# Patient Record
Sex: Female | Born: 1970 | Race: White | Hispanic: No | Marital: Single | Smoking: Current every day smoker
Health system: Southern US, Community
[De-identification: ages and names within clinical notes are randomized; demographics above are authoritative.]

## PROBLEM LIST (undated history)

## (undated) DIAGNOSIS — F32A Depression, unspecified: Secondary | ICD-10-CM

## (undated) DIAGNOSIS — Z5189 Encounter for other specified aftercare: Secondary | ICD-10-CM

## (undated) DIAGNOSIS — F419 Anxiety disorder, unspecified: Secondary | ICD-10-CM

## (undated) DIAGNOSIS — E079 Disorder of thyroid, unspecified: Secondary | ICD-10-CM

## (undated) DIAGNOSIS — K219 Gastro-esophageal reflux disease without esophagitis: Secondary | ICD-10-CM

## (undated) DIAGNOSIS — K760 Fatty (change of) liver, not elsewhere classified: Secondary | ICD-10-CM

## (undated) DIAGNOSIS — T7840XA Allergy, unspecified, initial encounter: Secondary | ICD-10-CM

## (undated) DIAGNOSIS — F329 Major depressive disorder, single episode, unspecified: Secondary | ICD-10-CM

## (undated) DIAGNOSIS — J45909 Unspecified asthma, uncomplicated: Secondary | ICD-10-CM

## (undated) HISTORY — DX: Disorder of thyroid, unspecified: E07.9

## (undated) HISTORY — DX: Major depressive disorder, single episode, unspecified: F32.9

## (undated) HISTORY — DX: Anxiety disorder, unspecified: F41.9

## (undated) HISTORY — DX: Depression, unspecified: F32.A

## (undated) HISTORY — DX: Gastro-esophageal reflux disease without esophagitis: K21.9

## (undated) HISTORY — DX: Fatty (change of) liver, not elsewhere classified: K76.0

## (undated) HISTORY — DX: Encounter for other specified aftercare: Z51.89

## (undated) HISTORY — DX: Unspecified asthma, uncomplicated: J45.909

## (undated) HISTORY — DX: Allergy, unspecified, initial encounter: T78.40XA

## (undated) HISTORY — PX: TONSILECTOMY, ADENOIDECTOMY, BILATERAL MYRINGOTOMY AND TUBES: SHX2538

## (undated) HISTORY — PX: EXTERNAL EAR SURGERY: SHX627

---

## 2018-05-06 ENCOUNTER — Encounter: Payer: Self-pay | Admitting: Pediatrics

## 2018-05-06 ENCOUNTER — Ambulatory Visit: Payer: Medicaid Other | Admitting: Pediatrics

## 2018-05-06 VITALS — BP 125/79 | HR 87 | Temp 98.4°F | Ht 65.0 in | Wt 222.4 lb

## 2018-05-06 DIAGNOSIS — M25512 Pain in left shoulder: Secondary | ICD-10-CM

## 2018-05-06 DIAGNOSIS — M79671 Pain in right foot: Secondary | ICD-10-CM

## 2018-05-06 DIAGNOSIS — K921 Melena: Secondary | ICD-10-CM

## 2018-05-06 DIAGNOSIS — M79672 Pain in left foot: Secondary | ICD-10-CM

## 2018-05-06 DIAGNOSIS — E039 Hypothyroidism, unspecified: Secondary | ICD-10-CM

## 2018-05-06 DIAGNOSIS — R5383 Other fatigue: Secondary | ICD-10-CM | POA: Diagnosis not present

## 2018-05-06 DIAGNOSIS — K219 Gastro-esophageal reflux disease without esophagitis: Secondary | ICD-10-CM

## 2018-05-06 DIAGNOSIS — F909 Attention-deficit hyperactivity disorder, unspecified type: Secondary | ICD-10-CM

## 2018-05-06 DIAGNOSIS — G8929 Other chronic pain: Secondary | ICD-10-CM

## 2018-05-06 MED ORDER — LEVOTHYROXINE SODIUM 175 MCG PO TABS: 175.0000 ug | ORAL_TABLET | Freq: Every day | ORAL | 1 refills | Status: DC

## 2018-05-06 MED ORDER — RANITIDINE HCL 150 MG PO TABS: 150.0000 mg | ORAL_TABLET | Freq: Every day | ORAL | 1 refills | Status: DC

## 2018-05-06 NOTE — Progress Notes (Signed)
Subjective:   Patient ID: Julie Alvarado, female    DOB: Mar 26, 1971, 47 y.o.   MRN: [id number] CC: New Patient (Initial Visit) Follow-up multiple medical problems and establish care HPI: Julie Alvarado is a 47 y.o. female   Recently moved here from [place].  Hypothyroidism: Taking medicine regularly.  ADHD: Was diagnosed by her PCP following testing over a decade ago she thinks.  Has been taking ADHD medicine since then off and on when she is able to get it with insurance.  Does help with her focus.  Reflux: Taking ranitidine regularly, symptoms stable.  Foot pain: Both of her feet hurt after she is been walking for some time.  She has arches the fall when she stands.  She thinks it starting to worsen her back pain.  Shoulder pain: Left shoulder she is not able to raise greater than 90 degrees from her side without assistance from her other arm due to pain mostly in the posterior shoulder and proximal arm.  Blood in stool: No pain with passing stool, no constipation, has never had trouble with hemorrhoids before.  Usually just blood on the toilet paper, sometimes small, sometimes dime sized or larger.  Once there was a  mucousy bright red blood clot that she passed.  No family history of colon cancer that she is aware of.  Past Medical History:  Diagnosis Date  . Allergy   . GERD (gastroesophageal reflux disease)   . Thyroid disease    Family History  Problem Relation Age of Onset  . Cancer Mother   . COPD Mother   . Cancer Maternal Grandmother   . COPD Maternal Grandmother    Social History   Socioeconomic History  . Marital status: Single    Spouse name: Not on file  . Number of children: Not on file  . Years of education: Not on file  . Highest education level: Not on file  Occupational History  . Not on file  Social Needs  . Financial resource strain: Not on file  . Food insecurity:    Worry: Not on file    Inability: Not on file  . Transportation needs:   Medical: Not on file    Non-medical: Not on file  Tobacco Use  . Smoking status: Current Every Day Smoker    Packs/day: 0.50  . Smokeless tobacco: Never Used  Substance and Sexual Activity  . Alcohol use: Never    Frequency: Never  . Drug use: Never  . Sexual activity: Yes    Birth control/protection: IUD  Lifestyle  . Physical activity:    Days per week: Not on file    Minutes per session: Not on file  . Stress: Not on file  Relationships  . Social connections:    Talks on phone: Not on file    Gets together: Not on file    Attends religious service: Not on file    Active member of club or organization: Not on file    Attends meetings of clubs or organizations: Not on file    Relationship status: Not on file  Other Topics Concern  . Not on file  Social History Narrative  . Not on file   ROS: All systems negative other than what is in HPI  Objective:    BP 125/79   Pulse 87   Temp 98.4 F (36.9 C) (Oral)   Ht '5\' 5"'$  (1.651 m)   Wt 222 lb 6.4 oz (100.9 kg)   BMI 37.01 kg/m  Wt Readings from Last 3 Encounters:  05/06/18 222 lb 6.4 oz (100.9 kg)    Gen: NAD, alert, cooperative with exam, NCAT EYES: EOMI, no conjunctival injection, or no icterus ENT:  TMs pearly gray b/l, OP without erythema LYMPH: no cervical LAD CV: NRRR, normal S1/S2, no murmur, distal pulses 2+ b/l Resp: CTABL, no wheezes, normal WOB Abd: +BS, soft, NTND.  Ext: No edema, warm Neuro: Alert and oriented, strength equal b/l UE and LE, coordination grossly normal MSK: Pronates both feet with standing. Left shoulder decreased strength in rotator cuff muscles left arm compared to right including with empty can.  Equal strength bilaterally with internal and external rotation at the shoulder  Assessment & Plan:  Krystine was seen today for new patient (initial visit) and follow-up multiple medical problems.  Diagnoses and all orders for this visit:  Gastroesophageal reflux disease, esophagitis  presence not specified Stable, continue below -     ranitidine (ZANTAC) 150 MG tablet; Take 1 tablet (150 mg total) by mouth daily.  Hypothyroidism, unspecified type Stable, continue below -     levothyroxine (SYNTHROID, LEVOTHROID) 175 MCG tablet; Take 1 tablet (175 mcg total) by mouth daily before breakfast. -     TSH  Other fatigue -     BMP8+EGFR -     CBC with Differential/Platelet  Pain in both feet -     Ambulatory referral to Podiatry  Chronic left shoulder pain -     Ambulatory referral to Orthopedic Surgery  Blood in stool Referral to GI  Attention deficit hyperactivity disorder (ADHD), unspecified ADHD type We will request records again from prior PCP, will bring patient back to discuss next steps once we have received those records including original testing for ADHD -     ToxASSURE Select 13 (MW), Urine   Follow up plan: Return in about 2 weeks (around 05/20/2018). Assunta Found, MD [place]

## 2018-05-07 LAB — CBC WITH DIFFERENTIAL/PLATELET
BASOS: 1 %
Basophils Absolute: 0 10*3/uL (ref 0.0–0.2)
EOS (ABSOLUTE): 0.2 10*3/uL (ref 0.0–0.4)
Eos: 3 %
Hematocrit: 42.4 % (ref 34.0–46.6)
Hemoglobin: 14.7 g/dL (ref 11.1–15.9)
IMMATURE GRANS (ABS): 0 10*3/uL (ref 0.0–0.1)
IMMATURE GRANULOCYTES: 0 %
LYMPHS: 33 %
Lymphocytes Absolute: 2.3 10*3/uL (ref 0.7–3.1)
MCH: 32.9 pg (ref 26.6–33.0)
MCHC: 34.7 g/dL (ref 31.5–35.7)
MCV: 95 fL (ref 79–97)
MONOS ABS: 0.6 10*3/uL (ref 0.1–0.9)
Monocytes: 8 %
NEUTROS PCT: 55 %
Neutrophils Absolute: 3.8 10*3/uL (ref 1.4–7.0)
PLATELETS: 295 10*3/uL (ref 150–450)
RBC: 4.47 x10E6/uL (ref 3.77–5.28)
RDW: 12.5 % (ref 12.3–15.4)
WBC: 6.9 10*3/uL (ref 3.4–10.8)

## 2018-05-07 LAB — BMP8+EGFR
BUN/Creatinine Ratio: 11 (ref 9–23)
BUN: 10 mg/dL (ref 6–24)
CALCIUM: 9.3 mg/dL (ref 8.7–10.2)
CHLORIDE: 104 mmol/L (ref 96–106)
CO2: 22 mmol/L (ref 20–29)
Creatinine, Ser: 0.88 mg/dL (ref 0.57–1.00)
GFR, EST AFRICAN AMERICAN: 91 mL/min/{1.73_m2} (ref >59)
GFR, EST NON AFRICAN AMERICAN: 79 mL/min/{1.73_m2} (ref >59)
Glucose: 102 mg/dL — ABNORMAL HIGH (ref 65–99)
Potassium: 4.3 mmol/L (ref 3.5–5.2)
Sodium: 142 mmol/L (ref 134–144)

## 2018-05-07 LAB — TSH: TSH: 1.02 u[IU]/mL (ref 0.450–4.500)

## 2018-05-09 ENCOUNTER — Encounter: Payer: Self-pay | Admitting: Internal Medicine

## 2018-05-11 ENCOUNTER — Ambulatory Visit (INDEPENDENT_AMBULATORY_CARE_PROVIDER_SITE_OTHER): Payer: Medicaid Other

## 2018-05-11 ENCOUNTER — Encounter (INDEPENDENT_AMBULATORY_CARE_PROVIDER_SITE_OTHER): Payer: Self-pay | Admitting: Orthopaedic Surgery

## 2018-05-11 ENCOUNTER — Ambulatory Visit (INDEPENDENT_AMBULATORY_CARE_PROVIDER_SITE_OTHER): Payer: Medicaid Other | Admitting: Orthopaedic Surgery

## 2018-05-11 VITALS — BP 111/71 | HR 86 | Ht 64.5 in | Wt 222.0 lb

## 2018-05-11 DIAGNOSIS — M25512 Pain in left shoulder: Secondary | ICD-10-CM

## 2018-05-11 DIAGNOSIS — G8929 Other chronic pain: Secondary | ICD-10-CM

## 2018-05-11 DIAGNOSIS — M25532 Pain in left wrist: Secondary | ICD-10-CM | POA: Diagnosis not present

## 2018-05-11 NOTE — Progress Notes (Signed)
   Office Visit Note   Patient: Julie Alvarado           Date of Birth: 1971-01-05           MRN: [id number] Visit Date: 05/11/2018              Requested by: Eustaquio Maize, MD [place], [place] [address] PCP: Eustaquio Maize, MD   Assessment & Plan: Visit Diagnoses:  1. Chronic left shoulder pain   2. Pain in left wrist     Plan: chronic left shoulder pain with mild adhesive capsulitis-PT at [facility]. Prior fx of left distal radius has healed. Follow-up next 4-6 weeks. Office visit greater than 45 min Follow-Up Instructions: Return if symptoms worsen or fail to improve.   Orders:  No orders of the defined types were placed in this encounter.  No orders of the defined types were placed in this encounter.     Procedures: No procedures performed   Clinical Data: No additional findings.   Subjective: No chief complaint on file. hx of left distal radius fracture in July while living in [place]-"just want check up"-no related problems. 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 Pain for at least 5 yrs and worse with overhead activity. No neck pain or radicular symptoms HPI  Review of Systems   Objective: Vital Signs: There were no vitals taken for this visit.  Physical Exam  Constitutional: She is oriented to person, place, and time. She appears well-developed and well-nourished.  HENT:  Mouth/Throat: Oropharynx is clear and moist.  Eyes: Pupils are equal, round, and reactive to light. EOM are normal.  Pulmonary/Chest: Effort normal.  Neurological: She is alert and oriented to person, place, and time.  Skin: Skin is warm and dry.  Psychiatric: She has a normal mood and affect. Her behavior is normal.    Ortho Exam left shoulder with pos impingement and tenderness over GT, skin intact.mild loss of overhead motion. Skin intact. N/v intact. Left wrist with no pain and FROM  Specialty Comments:  No specialty comments  available.  Imaging: No results found.   PMFS History: There are no active problems to display for this patient.  Past Medical History:  Diagnosis Date  . Allergy   . GERD (gastroesophageal reflux disease)   . Thyroid disease     Family History  Problem Relation Age of Onset  . Cancer Mother   . COPD Mother   . Cancer Maternal Grandmother   . COPD Maternal Grandmother     Past Surgical History:  Procedure Laterality Date  . CESAREAN SECTION    . EXTERNAL EAR SURGERY    . TONSILECTOMY, ADENOIDECTOMY, BILATERAL MYRINGOTOMY AND TUBES     Social History   Occupational History  . Not on file  Tobacco Use  . Smoking status: Current Every Day Smoker    Packs/day: 0.50  . Smokeless tobacco: Never Used  Substance and Sexual Activity  . Alcohol use: Never    Frequency: Never  . Drug use: Never  . Sexual activity: Yes    Birth control/protection: IUD     Garald Balding, MD   Note - This record has been created using [employer].  Chart creation errors have been sought, but may not always  have been located. Such creation errors do not reflect on  the standard of medical care.

## 2018-05-12 ENCOUNTER — Ambulatory Visit (INDEPENDENT_AMBULATORY_CARE_PROVIDER_SITE_OTHER): Payer: Medicaid Other | Admitting: Orthopaedic Surgery

## 2018-05-12 LAB — TOXASSURE SELECT 13 (MW), URINE

## 2018-05-16 ENCOUNTER — Ambulatory Visit: Payer: Medicaid Other | Admitting: Pediatrics

## 2018-05-16 ENCOUNTER — Encounter: Payer: Self-pay | Admitting: Pediatrics

## 2018-05-16 VITALS — BP 130/78 | HR 84 | Temp 98.4°F | Ht 65.0 in | Wt 225.2 lb

## 2018-05-16 DIAGNOSIS — G8929 Other chronic pain: Secondary | ICD-10-CM

## 2018-05-16 DIAGNOSIS — M25512 Pain in left shoulder: Secondary | ICD-10-CM | POA: Diagnosis not present

## 2018-05-16 DIAGNOSIS — F909 Attention-deficit hyperactivity disorder, unspecified type: Secondary | ICD-10-CM | POA: Diagnosis not present

## 2018-05-16 MED ORDER — AMPHETAMINE-DEXTROAMPHETAMINE 10 MG PO TABS: 10.0000 mg | ORAL_TABLET | Freq: Three times a day (TID) | ORAL | 0 refills | Status: DC

## 2018-05-16 NOTE — Progress Notes (Signed)
Subjective:   Patient ID: Julie Alvarado, female    DOB: 1971/04/30, 47 y.o.   MRN: [id number] CC: Medical Management of Chronic Issues  HPI: Julie Alvarado is a 47 y.o. female   ADHD: Was diagnosed over 10 years ago.  She has been off the medicine for over 6 months due to problems with cost and recent move from [place].  She was prescribed Adderall 10mg  from her PCPs office previously.  She finds she is having a hard time sitting up life in this area due to an inability to focus.  Hard time getting tasks done such as paying bills, sitting up at the store.  Received records from her prior PCP at [facility].  There she was prescribed and taking Adderall 10 mg 3 times a day though when she was running short on funds she would cut back to taking Adderall once or twice a day.  The Adderall last for about 4 hours. When she is working which she is planning on restarting work in the next month or so, she takes an Adderall in the morning and in the afternoon.  The third Adderall tab she takes when her son gets home from school to help with focus when she is needing to make dinner, help with homework, clean the house and prepare for the next day.  She is been tried on long-acting medicines in the past, they do not seem to have the same effect.  She has noticed an improvement in her concentration, ability to complete tasks when she is taking the Adderall regularly.  She is very frustrated with her self off the Adderall because she is not able to do all that she needs to do for her household or at work when working.  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  She feels safe at home, her mood has been okay.  [facility] was reviewed and consistent with patient records and history.  She was seen at [facility] for her left shoulder pain, diagnosed with adhesive capsulitis.  Recommended to start physical therapy, also  suggested a cortisone shot.  Patient did get a cortisone shot that day.  She would like referral to a female provider.  Relevant past medical, surgical, family and social history reviewed. Allergies and medications reviewed and updated. Social History   Tobacco Use  Smoking Status Current Every Day Smoker  . Packs/day: 0.50  Smokeless Tobacco Never Used   ROS: Per HPI   Objective:    BP 130/78   Pulse 84   Temp 98.4 F (36.9 C) (Oral)   Ht 5\' 5"  (1.651 m)   Wt 225 lb 3.2 oz (102.2 kg)   BMI 37.48 kg/m   Wt Readings from Last 3 Encounters:  05/16/18 225 lb 3.2 oz (102.2 kg)  05/06/18 222 lb 6.4 oz (100.9 kg)    Gen: NAD, alert, cooperative with exam, NCAT EYES: EOMI, no conjunctival injection, or no icterus ENT:  TMs pearly gray b/l, OP without erythema LYMPH: no cervical LAD CV: NRRR, normal S1/S2, no murmur, distal pulses 2+ b/l Resp: CTABL, no wheezes, normal WOB Abd: +BS, soft, NTND. no guarding or organomegaly Ext: No edema, warm Neuro: Alert and oriented, strength equal b/l UE and LE, coordination grossly normal MSK: normal muscle bulk  Assessment & Plan:  Julie Alvarado was seen today for medical management of chronic issues.  Diagnoses and all orders for this  visit:  Attention deficit hyperactivity disorder (ADHD), unspecified ADHD type Ongoing symptoms.  Asked about further resources for "life coach" to help with some of her ADHD symptoms.  She is not ready to start counseling now, but may be interested in the future.  Gave information for psychology today.com for her to search based on her insurance which may be changing in the future.  Patient to continue to follow blood pressures at home.  Let me know if persistently elevated once below is restarted.  Reviewed controlled substance agreement.  Must be seen for refills.  Urine tox done last visit, appropriate for not currently being on the medicine. -     amphetamine-dextroamphetamine (ADDERALL) 10 MG tablet; Take 1 tablet  (10 mg total) by mouth 3 (three) times daily before meals. -     amphetamine-dextroamphetamine (ADDERALL) 10 MG tablet; Take 1 tablet (10 mg total) by mouth 3 (three) times daily. -     amphetamine-dextroamphetamine (ADDERALL) 10 MG tablet; Take 1 tablet (10 mg total) by mouth 3 (three) times daily.  Chronic left shoulder pain Referral to [facility] orthopedic center, has a female nurse practitioner there.  -     AMB referral to orthopedics  I spent 25 minutes with the patient with over 50% of the encounter time dedicated to counseling on the above problems.   Follow up plan: Return in about 3 months (around 08/16/2018). Assunta Found, MD [place]

## 2018-05-16 NOTE — Patient Instructions (Addendum)
[email or web address]  120s/70s goal blood pressures

## 2018-05-23 ENCOUNTER — Encounter: Payer: Self-pay | Admitting: Podiatry

## 2018-05-23 ENCOUNTER — Ambulatory Visit (INDEPENDENT_AMBULATORY_CARE_PROVIDER_SITE_OTHER): Payer: Medicaid Other

## 2018-05-23 ENCOUNTER — Ambulatory Visit: Payer: Medicaid Other | Admitting: Podiatry

## 2018-05-23 VITALS — BP 133/83 | HR 88

## 2018-05-23 DIAGNOSIS — M7750 Other enthesopathy of unspecified foot: Secondary | ICD-10-CM

## 2018-05-23 DIAGNOSIS — M7752 Other enthesopathy of left foot: Secondary | ICD-10-CM

## 2018-05-23 DIAGNOSIS — M7751 Other enthesopathy of right foot: Secondary | ICD-10-CM

## 2018-05-23 DIAGNOSIS — M79676 Pain in unspecified toe(s): Secondary | ICD-10-CM

## 2018-05-23 NOTE — Patient Instructions (Signed)

## 2018-05-23 NOTE — Progress Notes (Signed)
   Subjective:    Patient ID: Julie Alvarado, female    DOB: 12-02-1970, 47 y.o.   MRN: [id number]  HPI Bilateral foot pain    Review of Systems  Musculoskeletal: Positive for back pain.  All other systems reviewed and are negative.      Objective:   Physical Exam        Assessment & Plan:

## 2018-05-25 NOTE — Progress Notes (Signed)
Subjective:   Patient ID: Julie Alvarado, female   DOB: 47 y.o.   MRN: [id number]   HPI Patient presents with pain in both feet states that she also gets problems with her back and feels like they could be related and is been an ongoing issue with moderate obesity also complicating factor   Review of Systems  All other systems reviewed and are negative.       Objective:  Physical Exam  Constitutional: She appears well-developed and well-nourished.  Cardiovascular: Intact distal pulses.  Pulmonary/Chest: Effort normal.  Musculoskeletal: Normal range of motion.  Neurological: She is alert.  Skin: Skin is warm.  Nursing note and vitals reviewed.   Neurovascular status was found to be intact muscle strength is adequate with moderate depression of the arch bilateral with information of the posterior tibial tendon bilateral with no indications of dysfunction of the tendon.  Patient did have good digital perfusion well oriented x3     Assessment:  Moderate depression of the arch bilateral with posterior tibial tendinitis symptomatology     Plan:  H&P condition reviewed and at this point discussed orthotics to try to lift the arch up and patient will pursue this option and we will do the best we can to help her and I also recommended over-the-counter devices.  Reappoint to recheck again for casting and encouraged her to call with questions  X-rays indicate no indications of advanced arthritis with mild depression of the arch noted bilateral upon weightbearing

## 2018-06-08 ENCOUNTER — Encounter: Payer: Self-pay | Admitting: Internal Medicine

## 2018-06-08 ENCOUNTER — Ambulatory Visit: Payer: Medicaid Other | Admitting: Internal Medicine

## 2018-06-08 VITALS — BP 124/64 | HR 78 | Ht 64.0 in | Wt 224.0 lb

## 2018-06-08 DIAGNOSIS — K625 Hemorrhage of anus and rectum: Secondary | ICD-10-CM | POA: Diagnosis not present

## 2018-06-08 DIAGNOSIS — K219 Gastro-esophageal reflux disease without esophagitis: Secondary | ICD-10-CM

## 2018-06-08 NOTE — Patient Instructions (Signed)
It has been recommended to you by your physician that you have a(n) colonoscopy completed. Per your request, we did not schedule the procedure(s) today. Please contact our office at [phone] should you decide to have the procedure completed. 

## 2018-06-08 NOTE — Progress Notes (Signed)
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 Evette Doffing regarding GERD and rectal bleeding.  The patient reports daily problems with painless hematochezia in August and September 2019.  Principally bright red blood on the tissue though she did not look at the stool.  No associated abdominal or rectal pain.  No change in stool caliber.  No weight loss.  No family history of colon cancer.  No prior history of GI evaluations or GI problems.  In recent weeks no significant problems with bleeding.  Her review of outside blood work from May 06, 2018 shows unremarkable comprehensive metabolic panel and CBC.  Hemoglobin 14.7.  No relevant x-ray abnormalities identified.  Patient also reports chronic GERD.  She had been on omeprazole regularly for some time but is quite worried about long-term effects".  Currently taking ranitidine 150 mg once daily.  She will experience breakthrough symptoms.  Particularly with dietary indiscretion.  No dysphasia.  No family history of esophageal malignancy.  REVIEW OF SYSTEMS:  All non-GI ROS negative less otherwise stated in the HPI except for sinus and allergy, anxiety, back pain, fatigue, menstrual pain, night sweats  Past Medical History:  Diagnosis Date  . Allergy   . GERD (gastroesophageal reflux disease)   . Thyroid disease     Past Surgical History:  Procedure Laterality Date  . CESAREAN SECTION    . EXTERNAL EAR SURGERY    . TONSILECTOMY, ADENOIDECTOMY, BILATERAL MYRINGOTOMY AND TUBES      Social History Kriti Katayama  reports that she has been smoking. She has been smoking about 0.50 packs per day. She has never used smokeless tobacco. She reports that she does not drink alcohol or use drugs.  family history includes COPD in her maternal grandmother and mother; Cancer in her maternal grandmother and mother.  No Known Allergies     PHYSICAL  EXAMINATION: Vital signs: BP 124/64   Pulse 78   Ht 5\' 4"  (1.626 m)   Wt 224 lb (101.6 kg)   BMI 38.45 kg/m   Constitutional: generally well-appearing, no acute distress Psychiatric: alert and oriented x3, cooperative Eyes: extraocular movements intact, anicteric, conjunctiva pink Mouth: oral pharynx moist, no lesions Neck: supple no lymphadenopathy Cardiovascular: heart regular rate and rhythm, no murmur Lungs: clear to auscultation bilaterally Abdomen: soft, obese, nontender, nondistended, no obvious ascites, no peritoneal signs, normal bowel sounds, no organomegaly Rectal: Deferred until colonoscopy Extremities: no lower extremity edema bilaterally Skin: no lesions on visible extremities Neuro: No focal deficits.  Cranial nerves intact  ASSESSMENT:  1.  Intermittent rectal bleeding.  Etiology uncertain.  Rule out neoplasia.  Rule out benign anorectal pathology such as internal hemorrhoids.  Normal hemoglobin 2.  Chronic GERD.  No alarm features 3.  Obesity   PLAN:  1.  Colonoscopy to evaluate bleeding.The nature of the procedure, as well as the risks, benefits, and alternatives were carefully and thoroughly reviewed with the patient. Ample time for discussion and questions allowed. The patient understood, was satisfied, and agreed to proceed. 2.  Reflux precautions with attention to weight loss 3.  Would increase ranitidine to 150 mg twice daily for breakthrough symptoms.  Could consider PPI if H2 receptor antagonist therapy ineffective 4.  Ongoing general medical care with Dr. Evette Doffing   A copy of this consultation note has been sent to Dr. Evette Doffing

## 2018-06-13 ENCOUNTER — Encounter: Payer: Medicaid Other | Admitting: Orthotics

## 2018-06-14 ENCOUNTER — Ambulatory Visit (AMBULATORY_SURGERY_CENTER): Payer: Self-pay

## 2018-06-14 VITALS — Ht 64.5 in | Wt 225.2 lb

## 2018-06-14 DIAGNOSIS — K625 Hemorrhage of anus and rectum: Secondary | ICD-10-CM

## 2018-06-14 MED ORDER — NA SULFATE-K SULFATE-MG SULF 17.5-3.13-1.6 GM/177ML PO SOLN: 1.0000 | Freq: Once | ORAL | 0 refills | Status: AC

## 2018-06-14 NOTE — Progress Notes (Signed)
Denies allergies to eggs or soy products. Denies complication of anesthesia or sedation. Denies use of weight loss medication. Denies use of O2.   Emmi instructions declined.  

## 2018-06-21 ENCOUNTER — Encounter: Payer: Self-pay | Admitting: Internal Medicine

## 2018-06-21 ENCOUNTER — Ambulatory Visit (AMBULATORY_SURGERY_CENTER): Payer: Medicaid Other | Admitting: Internal Medicine

## 2018-06-21 VITALS — BP 127/77 | HR 74 | Temp 98.4°F | Resp 17 | Ht 64.0 in | Wt 224.0 lb

## 2018-06-21 DIAGNOSIS — D123 Benign neoplasm of transverse colon: Secondary | ICD-10-CM | POA: Diagnosis not present

## 2018-06-21 DIAGNOSIS — K625 Hemorrhage of anus and rectum: Secondary | ICD-10-CM

## 2018-06-21 DIAGNOSIS — D125 Benign neoplasm of sigmoid colon: Secondary | ICD-10-CM

## 2018-06-21 DIAGNOSIS — K635 Polyp of colon: Secondary | ICD-10-CM

## 2018-06-21 HISTORY — PX: COLONOSCOPY: SHX174

## 2018-06-21 MED ORDER — SODIUM CHLORIDE 0.9 % IV SOLN: 500.0000 mL | Freq: Once | INTRAVENOUS | Status: DC

## 2018-06-21 NOTE — Progress Notes (Signed)
PT taken to PACU. Monitors in place. VSS. Report given to RN. 

## 2018-06-21 NOTE — Patient Instructions (Signed)
Handouts provided:  Polyps  YOU HAD AN ENDOSCOPIC PROCEDURE TODAY AT THE [facility]:   Refer to the procedure report that was given to you for any specific questions about what was found during the examination.  If the procedure report does not answer your questions, please call your gastroenterologist to clarify.  If you requested that your care partner not be given the details of your procedure findings, then the procedure report has been included in a sealed envelope for you to review at your convenience later.  YOU SHOULD EXPECT: Some feelings of bloating in the abdomen. Passage of more gas than usual.  Walking can help get rid of the air that was put into your GI tract during the procedure and reduce the bloating. If you had a lower endoscopy (such as a colonoscopy or flexible sigmoidoscopy) you may notice spotting of blood in your stool or on the toilet paper. If you underwent a bowel prep for your procedure, you may not have a normal bowel movement for a few days.  Please Note:  You might notice some irritation and congestion in your nose or some drainage.  This is from the oxygen used during your procedure.  There is no need for concern and it should clear up in a day or so.  SYMPTOMS TO REPORT IMMEDIATELY:   Following lower endoscopy (colonoscopy or flexible sigmoidoscopy):  Excessive amounts of blood in the stool  Significant tenderness or worsening of abdominal pains  Swelling of the abdomen that is new, acute  Fever of 100F or higher  For urgent or emergent issues, a gastroenterologist can be reached at any hour by calling [phone].   DIET:  We do recommend a small meal at first, but then you may proceed to your regular diet.  Drink plenty of fluids but you should avoid alcoholic beverages for 24 hours.  ACTIVITY:  You should plan to take it easy for the rest of today and you should NOT DRIVE or use heavy machinery until tomorrow (because of the sedation  medicines used during the test).    FOLLOW UP: Our staff will call the number listed on your records the next business day following your procedure to check on you and address any questions or concerns that you may have regarding the information given to you following your procedure. If we do not reach you, we will leave a message.  However, if you are feeling well and you are not experiencing any problems, there is no need to return our call.  We will assume that you have returned to your regular daily activities without incident.  If any biopsies were taken you will be contacted by phone or by letter within the next 1-3 weeks.  Please call us at [phone] if you have not heard about the biopsies in 3 weeks.    SIGNATURES/CONFIDENTIALITY: You and/or your care partner have signed paperwork which will be entered into your electronic medical record.  These signatures attest to the fact that that the information above on your After Visit Summary has been reviewed and is understood.  Full responsibility of the confidentiality of this discharge information lies with you and/or your care-partner.  

## 2018-06-21 NOTE — Progress Notes (Signed)
Called to room to assist during endoscopic procedure.  Patient ID and intended procedure confirmed with present staff. Received instructions for my participation in the procedure from the performing physician.  

## 2018-06-21 NOTE — Op Note (Addendum)
[facility] Patient Name: Julie Alvarado Procedure Date: 06/21/2018 12:58 PM MRN: [id number] Endoscopist: Docia Chuck. Henrene Pastor , MD Age: 47 Referring MD:  Date of Birth: [birth year] Gender: Female Account #: [id number] Procedure:                Colonoscopy with cold and hot snare polypectomy                            each x1; submucosal injection Indications:              Rectal bleeding Medicines:                Monitored Anesthesia Care Procedure:                Pre-Anesthesia Assessment:                           - Prior to the procedure, a History and Physical                            was performed, and patient medications and                            allergies were reviewed. The patient's tolerance of                            previous anesthesia was also reviewed. The risks                            and benefits of the procedure and the sedation                            options and risks were discussed with the patient.                            All questions were answered, and informed consent                            was obtained. Prior Anticoagulants: The patient has                            taken no previous anticoagulant or antiplatelet                            agents. ASA Grade Assessment: II - A patient with                            mild systemic disease. After reviewing the risks                            and benefits, the patient was deemed in                            satisfactory condition to undergo the procedure.  After obtaining informed consent, the colonoscope                            was passed under direct vision. Throughout the                            procedure, the patient's blood pressure, pulse, and                            oxygen saturations were monitored continuously. The                            Model CF-HQ190L [id number]) scope was introduced                            through the anus and  advanced to the the cecum,                            identified by appendiceal orifice and ileocecal                            valve. The ileocecal valve, appendiceal orifice,                            and rectum were photographed. The quality of the                            bowel preparation was excellent. The colonoscopy                            was performed without difficulty. The patient                            tolerated the procedure well. The bowel preparation                            used was SUPREP. Scope In: 1:02:23 PM Scope Out: 1:18:57 PM Scope Withdrawal Time: 0 hours 14 minutes 1 second  Total Procedure Duration: 0 hours 16 minutes 34 seconds  Findings:                 A 4 mm polyp was found in the transverse colon. The                            polyp was removed with a cold snare. Resection and                            retrieval were complete.                           A 20 mm polyp was found in the sigmoid colon.at 20                            cm from the anal vergepolyp was  semi-pedunculated.                            The polyp was removed with a hot snare. Resection                            and retrieval were complete. Area was tattooed with                            an injection of 2 mL of Spot (carbon black). Please                            see the images                           The exam was otherwise without abnormality on                            direct and retroflexion views. Complications:            No immediate complications. Estimated blood loss:                            None. Estimated Blood Loss:     Estimated blood loss: none. Impression:               - One 4 mm polyp in the transverse colon, removed                            with a cold snare. Resected and retrieved.                           - One 20 mm polyp in the sigmoid colon, removed                            with a hot snare. Resected and retrieved. Tattooed.                            - The examination was otherwise normal on direct                            and retroflexion views. Recommendation:           - Repeat colonoscopy in 3 years for surveillance if                            lesions benign.                           - Patient has a contact number available for                            emergencies. The signs and symptoms of potential                            delayed  complications were discussed with the                            patient. Return to normal activities tomorrow.                            Written discharge instructions were provided to the                            patient.                           - Resume previous diet.                           - Continue present medications.                           - Await pathology results. Docia Chuck. Henrene Pastor, MD 06/21/2018 1:28:59 PM This report has been signed electronically.

## 2018-06-22 ENCOUNTER — Telehealth: Payer: Self-pay | Admitting: *Deleted

## 2018-06-22 NOTE — Telephone Encounter (Signed)
  Follow up Call-  Call back number 06/21/2018  Post procedure Call Back phone  # [phone]  Permission to leave phone message Yes     Patient questions:  Do you have a fever, pain , or abdominal swelling? No. Pain Score  0 *  Have you tolerated food without any problems? Yes.    Have you been able to return to your normal activities? Yes.    Do you have any questions about your discharge instructions: Diet   No. Medications  No. Follow up visit  No.  Do you have questions or concerns about your Care? No.  Actions: * If pain score is 4 or above: No action needed, pain <4.

## 2018-06-27 ENCOUNTER — Encounter: Payer: Self-pay | Admitting: Internal Medicine

## 2018-06-29 ENCOUNTER — Encounter: Payer: Self-pay | Admitting: Pediatrics

## 2018-07-08 ENCOUNTER — Ambulatory Visit: Payer: Medicaid Other | Admitting: Pediatrics

## 2018-07-08 ENCOUNTER — Ambulatory Visit: Payer: Medicaid Other | Admitting: Orthotics

## 2018-07-08 ENCOUNTER — Encounter: Payer: Self-pay | Admitting: Pediatrics

## 2018-07-08 VITALS — BP 135/88 | HR 81 | Temp 97.1°F | Ht 64.0 in | Wt 226.4 lb

## 2018-07-08 DIAGNOSIS — K219 Gastro-esophageal reflux disease without esophagitis: Secondary | ICD-10-CM | POA: Diagnosis not present

## 2018-07-08 DIAGNOSIS — Z720 Tobacco use: Secondary | ICD-10-CM

## 2018-07-08 DIAGNOSIS — F909 Attention-deficit hyperactivity disorder, unspecified type: Secondary | ICD-10-CM | POA: Diagnosis not present

## 2018-07-08 DIAGNOSIS — E039 Hypothyroidism, unspecified: Secondary | ICD-10-CM | POA: Diagnosis not present

## 2018-07-08 DIAGNOSIS — M7750 Other enthesopathy of unspecified foot: Secondary | ICD-10-CM

## 2018-07-08 MED ORDER — LEVOTHYROXINE SODIUM 175 MCG PO TABS: 175.0000 ug | ORAL_TABLET | Freq: Every day | ORAL | 1 refills | Status: DC

## 2018-07-08 MED ORDER — SUCRALFATE 1 GM/10ML PO SUSP: 1.0000 g | Freq: Three times a day (TID) | ORAL | 0 refills | Status: DC | PRN

## 2018-07-08 MED ORDER — BUPROPION HCL ER (SR) 150 MG PO TB12: ORAL_TABLET | ORAL | 3 refills | Status: DC

## 2018-07-08 NOTE — Progress Notes (Signed)
Patient came in today to pick up custom made foot orthotics.  The goals were accomplished and the patient reported no dissatisfaction with said orthotics.  Patient was advised of breakin period and how to report any issues. 

## 2018-07-08 NOTE — Progress Notes (Signed)
  Subjective:   Patient ID: Julie Alvarado, female    DOB: [birth year], 47 y.o.   MRN: [id number] CC: Medical Management of Chronic Issues  HPI: Julie Alvarado is a 47 y.o. female   Here for refill of ADHD meds.  3 prescriptions were sent in last visit 6 weeks ago.  Called pharmacy, they report having two prescriptions on file.  Tobacco use: Interested in trial of Wellbutrin to see if will help with cravings.  Very interested in cessation.  Stomach pain: Skipped breakfast this morning.  Having epigastric discomfort right now.  Relevant past medical, surgical, family and social history reviewed. Allergies and medications reviewed and updated. Social History   Tobacco Use  Smoking Status Current Every Day Smoker  . Packs/day: 0.50  Smokeless Tobacco Never Used   ROS: Per HPI   Objective:    BP 135/88   Pulse 81   Temp (!) 97.1 F (36.2 C) (Oral)   Ht 5\' 4"  (1.626 m)   Wt 226 lb 6.4 oz (102.7 kg)   BMI 38.86 kg/m   Wt Readings from Last 3 Encounters:  07/08/18 226 lb 6.4 oz (102.7 kg)  06/21/18 224 lb (101.6 kg)  06/14/18 225 lb 3.2 oz (102.2 kg)    Gen: NAD, alert, cooperative with exam, NCAT EYES: EOMI, no conjunctival injection, or no icterus CV: NRRR, normal S1/S2, no murmur, distal pulses 2+ b/l Resp: CTABL, no wheezes, normal WOB Abd: +BS, soft, NTND. Ext: No edema, warm Neuro: Alert and oriented, strength equal b/l UE and LE, coordination grossly normal MSK: normal muscle bulk  Assessment & Plan:  [name] was seen today for medical management of chronic issues.  Diagnoses and all orders for this visit:  Gastroesophageal reflux disease, esophagitis presence not specified Continue omeprazole.  Can take below for breakthrough symptoms.  If ongoing let me know.  Rarely happens to this extent per pt. -     sucralfate (CARAFATE) 1 GM/10ML suspension; Take 10 mLs (1 g total) by mouth 3 (three) times daily as needed.  Attention deficit hyperactivity disorder (ADHD),  unspecified ADHD type Should be able to pick up this month prescription in next month prescription from pharmacy.  Patient to let me know if any further trouble.  Hypothyroidism, unspecified type -     levothyroxine (SYNTHROID, LEVOTHROID) 175 MCG tablet; Take 1 tablet (175 mcg total) by mouth daily before breakfast.  Tobacco use Cessation strategies discussed, start below -     buPROPion (WELLBUTRIN SR) 150 MG 12 hr tablet; Once daily for 3 days then twice a day   Follow up plan: Return in about 6 weeks (around 08/19/2018). Assunta Found, MD [place]

## 2018-08-16 ENCOUNTER — Ambulatory Visit: Payer: Medicaid Other | Admitting: Pediatrics

## 2018-08-17 ENCOUNTER — Ambulatory Visit: Payer: Medicaid Other | Admitting: Pediatrics

## 2018-08-26 ENCOUNTER — Ambulatory Visit: Payer: Medicaid Other | Admitting: Pediatrics

## 2018-08-29 ENCOUNTER — Encounter: Payer: Self-pay | Admitting: Family Medicine

## 2018-08-29 ENCOUNTER — Ambulatory Visit: Payer: Medicaid Other | Admitting: Family Medicine

## 2018-08-29 VITALS — BP 116/68 | HR 87 | Temp 97.8°F | Ht 64.0 in | Wt 223.0 lb

## 2018-08-29 DIAGNOSIS — F17200 Nicotine dependence, unspecified, uncomplicated: Secondary | ICD-10-CM

## 2018-08-29 DIAGNOSIS — F900 Attention-deficit hyperactivity disorder, predominantly inattentive type: Secondary | ICD-10-CM | POA: Diagnosis not present

## 2018-08-29 DIAGNOSIS — E038 Other specified hypothyroidism: Secondary | ICD-10-CM

## 2018-08-29 DIAGNOSIS — E063 Autoimmune thyroiditis: Secondary | ICD-10-CM

## 2018-08-29 DIAGNOSIS — F909 Attention-deficit hyperactivity disorder, unspecified type: Secondary | ICD-10-CM | POA: Insufficient documentation

## 2018-08-29 MED ORDER — AMPHETAMINE-DEXTROAMPHETAMINE 10 MG PO TABS: ORAL_TABLET | ORAL | 0 refills | Status: DC

## 2018-08-29 NOTE — Patient Instructions (Addendum)
As we discussed, if you'd like a referral to a gyn for your pap/ IUD removal/ replacement, let me know.   Dr Dettinger does offer these services here.  If you'd like to schedule an appointment with him, please do so.  Pertaining to your mammogram, these services are offered here.  Please schedule an appointment at checkout.

## 2018-08-29 NOTE — Progress Notes (Signed)
Subjective: CC: f/u ADHD, tobacco use d/o PCP: Julie Norlander, DO VOH:YWVP [name] is a 48 y.o. female presenting to clinic today for:  1. ADHD, inattentive Patient reports diagnosis of adult onset ADHD in her 24s.  She has been treated with Adderall, which she takes 20 mg every morning and 10 mg daily afternoon.  She reports control of symptoms with this.  She does report dry mouth but this is relieved by hydration.  Denies any constipation, increased anxiety, insomnia or heart palpitations.  Certainly no chest pain or shortness of breath.  She is interested in biofeedback and wonders if this is covered by her insurance.  She would like to pursue nonmedicinal treatment if possible.  2.  Tobacco use disorder Patient reports longstanding history of tobacco use.  She started smoking around age 55 and smokes 1 pack/day.  She is intermittently quit for up to 3 years at a time after the delivery of her child but has continued smoking since that time.  She denies any shortness of breath.  She was prescribed Wellbutrin at last visit by her previous PCP in efforts to help with cessation, ADHD as above and even possibly weight loss.  She has not started this medicine because she voices reluctance to start additional medications.  She has taken Wellbutrin in the past and did fine with it but did quit cold [place] and subsequently had symptoms related to withdrawal from the medicine.  She does plan on starting this soon as she is motivated to stop smoking.  3.  Hashimoto's thyroiditis Patient reports longstanding history of Hashimoto thyroiditis.  She takes Synthroid 175 mcg daily and does well with this.  Denies any constipation.  No other family members with autoimmune disease including thyroid disease, type 1 diabetes, rheumatoid arthritis or lupus.  4.  Preventive care Patient has not had mammogram or Pap smear in quite some time.  She thinks her last Pap smear was after the insertion of her IUD  which will be due to come out in April.  She does not have a gynecologist locally.  She was last seen in [place].  She also needs mammogram.  Family history significant for breast cancer in her mother which was diagnosed in her 73s.   ROS: Per HPI  No Known Allergies Past Medical History:  Diagnosis Date  . Allergy   . Anxiety   . Asthma   . Blood transfusion without reported diagnosis   . Depression   . GERD (gastroesophageal reflux disease)   . Thyroid disease     Current Outpatient Medications:  .  amphetamine-dextroamphetamine (ADDERALL) 10 MG tablet, Take 1 tablet (10 mg total) by mouth 3 (three) times daily., Disp: 90 tablet, Rfl: 0 .  levothyroxine (SYNTHROID, LEVOTHROID) 175 MCG tablet, Take 1 tablet (175 mcg total) by mouth daily before breakfast., Disp: 90 tablet, Rfl: 1 .  loratadine (CLARITIN) 10 MG tablet, Take 10 mg by mouth daily., Disp: , Rfl:  .  Multiple Vitamin (MULTIVITAMIN) tablet, Take 1 tablet by mouth daily., Disp: , Rfl:  .  omeprazole (PRILOSEC) 20 MG capsule, Take 20 mg by mouth daily., Disp: , Rfl:  .  OVER THE COUNTER MEDICATION, Vitamin D 3 2000 IU one capsule daily., Disp: , Rfl:  .  buPROPion (WELLBUTRIN SR) 150 MG 12 hr tablet, Once daily for 3 days then twice a day (Patient not taking: Reported on 08/29/2018), Disp: 60 tablet, Rfl: 3 .  sucralfate (CARAFATE) 1 GM/10ML suspension, Take 10 mLs (  1 g total) by mouth 3 (three) times daily as needed. (Patient not taking: Reported on 08/29/2018), Disp: 420 mL, Rfl: 0 Social History   Socioeconomic History  . Marital status: Single    Spouse name: Not on file  . Number of children: Not on file  . Years of education: Not on file  . Highest education level: Not on file  Occupational History  . Not on file  Social Needs  . Financial resource strain: Not on file  . Food insecurity:    Worry: Not on file    Inability: Not on file  . Transportation needs:    Medical: Not on file    Non-medical: Not on  file  Tobacco Use  . Smoking status: Current Every Day Smoker    Packs/day: 0.50  . Smokeless tobacco: Never Used  Substance and Sexual Activity  . Alcohol use: Yes    Frequency: Never    Comment: Rarely  . Drug use: Never  . Sexual activity: Yes    Birth control/protection: I.U.D.  Lifestyle  . Physical activity:    Days per week: Not on file    Minutes per session: Not on file  . Stress: Not on file  Relationships  . Social connections:    Talks on phone: Not on file    Gets together: Not on file    Attends religious service: Not on file    Active member of club or organization: Not on file    Attends meetings of clubs or organizations: Not on file    Relationship status: Not on file  . Intimate partner violence:    Fear of current or ex partner: Not on file    Emotionally abused: Not on file    Physically abused: Not on file    Forced sexual activity: Not on file  Other Topics Concern  . Not on file  Social History Narrative  . Not on file   Family History  Problem Relation Age of Onset  . Cancer Mother   . COPD Mother   . Cancer Maternal Grandmother   . COPD Maternal Grandmother   . Colon cancer Neg Hx   . Esophageal cancer Neg Hx   . Rectal cancer Neg Hx   . Stomach cancer Neg Hx     Objective: Office vital signs reviewed. BP 116/68   Pulse 87   Temp 97.8 F (36.6 C) (Oral)   Ht 5\' 4"  (1.626 m)   Wt 223 lb (101.2 kg)   BMI 38.28 kg/m   Physical Examination:  General: Awake, alert, well nourished, No acute distress HEENT: Normal    Neck: No goiter noted    Eyes: sclera white, no exophthalmos Cardio: regular rate and rhythm, S1S2 heard, no murmurs appreciated Pulm: clear to auscultation bilaterally, no wheezes, rhonchi or rales; normal work of breathing on room air Psych: Mood stable, speech normal, affect appropriate, pleasant interactive Depression screen [facility] 2/9 08/29/2018 07/08/2018 05/16/2018  Decreased Interest 0 0 0  Down, Depressed,  Hopeless 0 0 0  PHQ - 2 Score 0 0 0  Altered sleeping 0 - -  Tired, decreased energy 0 - -  Change in appetite 0 - -  Feeling bad or failure about yourself  0 - -  Trouble concentrating 0 - -  Moving slowly or fidgety/restless 0 - -  Suicidal thoughts 0 - -  PHQ-9 Score 0 - -  Difficult doing work/chores Not difficult at all - -   GAD 7 :  Generalized Anxiety Score 08/29/2018  Nervous, Anxious, on Edge 1  Control/stop worrying 1  Worry too much - different things 1  Trouble relaxing 0  Restless 0  Easily annoyed or irritable 0  Afraid - awful might happen 0  Total GAD 7 Score 3  Anxiety Difficulty Somewhat difficult    Assessment/ Plan: 48 y.o. female   1. Attention deficit hyperactivity disorder (ADHD), predominantly inattentive type Seemingly stable on Adderall 20 mg every morning and 10 mg every afternoon.  We briefly discussed consideration for extended release.  Patient may even benefit from alternative therapies like Vyvanse.  I certainly think it is worth pursuing biofeedback for treatment of ADHD if this interest her and is covered by her insurance.  She will look into this further and I will happily place a referral if she desires.  Otherwise, her Adderall has been refilled x3 months.  Her UDS should be obtained at next visit, as she did not take her dose today.  Updated controlled substance contract signed and reviewed with the patient today. - amphetamine-dextroamphetamine (ADDERALL) 10 MG tablet; Take 20mg  every morning and 10mg  every afternoon  Dispense: 90 tablet; Refill: 0  2. Hypothyroidism due to Hashimoto's thyroiditis Stable.  I reviewed her last TSH which was in normal range in October.  No refills needed at this time  3. Tobacco use disorder We discussed Wellbutrin further today.  I think that she should consider pursuing this medication.  She will follow-up with me in 3 months, sooner if needed.  Pertaining to her preventative care, she will schedule a  mammogram at checkout today.  I have instructed her to either contact me for referral to GYN or schedule an appoint with Dr. Warrick Parisian for Pap smear with IUD removal and replacement.  The Narcotic Database has been reviewed.  There were no red flags.     No orders of the defined types were placed in this encounter.  Meds ordered this encounter  Medications  . amphetamine-dextroamphetamine (ADDERALL) 10 MG tablet    Sig: Take 20mg  every morning and 10mg  every afternoon    Dispense:  90 tablet    Refill:  0  . amphetamine-dextroamphetamine (ADDERALL) 10 MG tablet    Sig: Take 20mg  every morning and 10mg  every afternoon    Dispense:  90 tablet    Refill:  0  . amphetamine-dextroamphetamine (ADDERALL) 10 MG tablet    Sig: Take 20mg  every morning and 10mg  every afternoon    Dispense:  90 tablet    Refill:  0     Svetlana Bagby Windell Moulding, DO [place] [phone]

## 2018-09-02 ENCOUNTER — Encounter: Payer: Self-pay | Admitting: *Deleted

## 2018-11-29 ENCOUNTER — Ambulatory Visit: Payer: Medicaid Other | Admitting: Family Medicine

## 2018-12-05 ENCOUNTER — Encounter: Payer: Self-pay | Admitting: Family Medicine

## 2018-12-05 ENCOUNTER — Ambulatory Visit (INDEPENDENT_AMBULATORY_CARE_PROVIDER_SITE_OTHER): Payer: Medicaid Other | Admitting: Family Medicine

## 2018-12-05 ENCOUNTER — Other Ambulatory Visit: Payer: Self-pay

## 2018-12-05 DIAGNOSIS — F17200 Nicotine dependence, unspecified, uncomplicated: Secondary | ICD-10-CM | POA: Diagnosis not present

## 2018-12-05 DIAGNOSIS — E038 Other specified hypothyroidism: Secondary | ICD-10-CM | POA: Diagnosis not present

## 2018-12-05 DIAGNOSIS — E063 Autoimmune thyroiditis: Secondary | ICD-10-CM

## 2018-12-05 DIAGNOSIS — K219 Gastro-esophageal reflux disease without esophagitis: Secondary | ICD-10-CM

## 2018-12-05 DIAGNOSIS — M7712 Lateral epicondylitis, left elbow: Secondary | ICD-10-CM

## 2018-12-05 DIAGNOSIS — F900 Attention-deficit hyperactivity disorder, predominantly inattentive type: Secondary | ICD-10-CM | POA: Diagnosis not present

## 2018-12-05 MED ORDER — BUPROPION HCL ER (XL) 150 MG PO TB24: 150.0000 mg | ORAL_TABLET | Freq: Every day | ORAL | 1 refills | Status: DC

## 2018-12-05 MED ORDER — AMPHETAMINE-DEXTROAMPHETAMINE 10 MG PO TABS: ORAL_TABLET | ORAL | 0 refills | Status: DC

## 2018-12-05 MED ORDER — LEVOTHYROXINE SODIUM 175 MCG PO TABS: 175.0000 ug | ORAL_TABLET | Freq: Every day | ORAL | 1 refills | Status: DC

## 2018-12-05 MED ORDER — SUCRALFATE 1 GM/10ML PO SUSP: 1.0000 g | Freq: Three times a day (TID) | ORAL | 0 refills | Status: DC | PRN

## 2018-12-05 MED ORDER — DICLOFENAC SODIUM 1 % TD GEL: 4.0000 g | Freq: Four times a day (QID) | TRANSDERMAL | 1 refills | Status: DC

## 2018-12-05 NOTE — Progress Notes (Signed)
Telephone visit  Subjective: CC: ADHD PCP: Janora Norlander, DO LHT:DSKA Julie Alvarado is a 48 y.o. female calls for telephone consult today. Patient provides verbal consent for consult held via phone.  Location of patient: home Location of provider: [facility] Others present for call: none  1. ADHD Patient reports stability of ADHD.  She reports compliance with 20 mg of Adderall every morning and 10 mg of Adderall every afternoon.  No constipation, abdominal pain, heart palpitations, dry mouth.  2. Hashimoto's thyroiditis Seemingly controlled.  She still struggles with weight loss.  She denies any constipation, diarrhea, tremor, heart palpitations.  3.  Tobacco use disorder Patient reports that she has been gradually reducing cigarettes 1 cigarette every few weeks.  She was able to get down to 3 cigarettes/day then the COVID-19 outbreak occurred and she is back up to 4 cigarettes/day.  She reports intermittent compliance with Wellbutrin.  She is often not remembering to take the evening dose.  She is not entirely sure that it is effective but again has not been compliant with the medicine.  No hemoptysis.  No change in breathing.  4.  Elbow pain Patient reports left-sided elbow pain that has been flared for the last several weeks.  She has a history of left-sided shoulder pain.  She notes that when she raised her left upper extremity up that she had worsening of the pain.  She has been applying ice, using Tylenol and a brace in efforts to improve pain.  She wonders if she should seek specialist evaluation or physical therapy.  She did find physical therapy for the shoulder previously helpful.   ROS: Per HPI  No Known Allergies Past Medical History:  Diagnosis Date  . Allergy   . Anxiety   . Asthma   . Blood transfusion without reported diagnosis   . Depression   . GERD (gastroesophageal reflux disease)   . Thyroid disease     Current Outpatient Medications:  .   amphetamine-dextroamphetamine (ADDERALL) 10 MG tablet, Take 20mg  every morning and 10mg  every afternoon, Disp: 90 tablet, Rfl: 0 .  amphetamine-dextroamphetamine (ADDERALL) 10 MG tablet, Take 20mg  every morning and 10mg  every afternoon, Disp: 90 tablet, Rfl: 0 .  amphetamine-dextroamphetamine (ADDERALL) 10 MG tablet, Take 20mg  every morning and 10mg  every afternoon, Disp: 90 tablet, Rfl: 0 .  buPROPion (WELLBUTRIN SR) 150 MG 12 hr tablet, Once daily for 3 days then twice a day (Patient not taking: Reported on 08/29/2018), Disp: 60 tablet, Rfl: 3 .  famotidine (PEPCID) 20 MG tablet, Take 20 mg by mouth daily., Disp: , Rfl:  .  levothyroxine (SYNTHROID, LEVOTHROID) 175 MCG tablet, Take 1 tablet (175 mcg total) by mouth daily before breakfast., Disp: 90 tablet, Rfl: 1 .  loratadine (CLARITIN) 10 MG tablet, Take 10 mg by mouth daily., Disp: , Rfl:  .  Multiple Vitamin (MULTIVITAMIN) tablet, Take 1 tablet by mouth daily., Disp: , Rfl:  .  OVER THE COUNTER MEDICATION, Vitamin D 3 2000 IU one capsule daily., Disp: , Rfl:  .  sucralfate (CARAFATE) 1 GM/10ML suspension, Take 10 mLs (1 g total) by mouth 3 (three) times daily as needed. (Patient not taking: Reported on 08/29/2018), Disp: 420 mL, Rfl: 0  Assessment/ Plan: 48 y.o. female   1. Attention deficit hyperactivity disorder (ADHD), predominantly inattentive type Controlled.  [place] narcotic database reviewed.  There were no red flags.  Refill Adderall - amphetamine-dextroamphetamine (ADDERALL) 10 MG tablet; Take 20mg  every morning and 10mg  every afternoon  Dispense: 90 tablet;  Refill: 0 - amphetamine-dextroamphetamine (ADDERALL) 10 MG tablet; Take 20mg  every morning and 10mg  every afternoon  Dispense: 90 tablet; Refill: 0 - amphetamine-dextroamphetamine (ADDERALL) 10 MG tablet; Take 20mg  every morning and 10mg  every afternoon  Dispense: 90 tablet; Refill: 0  2. Hypothyroidism due to Hashimoto's thyroiditis Working on weight loss.  Plan for repeat TSH in  the next 3 to 6 months. - levothyroxine (SYNTHROID) 175 MCG tablet; Take 1 tablet (175 mcg total) by mouth daily before breakfast.  Dispense: 90 tablet; Refill: 1  3. Gastroesophageal reflux disease without esophagitis Intermittently controlled.  Refill Carafate.  Continue Pepcid. - sucralfate (CARAFATE) 1 GM/10ML suspension; Take 10 mLs (1 g total) by mouth 3 (three) times daily as needed.  Dispense: 420 mL; Refill: 0  4. Tobacco use disorder In the action phase of cessation.  Some difficulty remembering Wellbutrin.  Therefore I have placed her on the extended release dose.  This should help with depressive symptoms as well.  5. Lateral epicondylitis of left elbow Use topical Voltaren.  Cannot take oral NSAIDs secondary to GERD.  Continue Tylenol as needed.  She will consider referral to orthopedics if ongoing issue.  We also discussed consideration for prednisone. - diclofenac sodium (VOLTAREN) 1 % GEL; Apply 4 g topically 4 (four) times daily.  Dispense: 400 g; Refill: 1   Start time: 1:26pm End time: 1:44pm  Total time spent on patient care (including telephone call/ virtual visit): 22 minutes  [place], [place] [phone]

## 2018-12-05 NOTE — Patient Instructions (Signed)
I couldn't get the PDF to upload, so I have mailed the home PT for the elbow to your home.   Tennis Elbow  Tennis elbow (lateral epicondylitis) is inflammation of tendons in your outer forearm, near your elbow. Tendons are tissues that connect muscle to bone. When you have tennis elbow, inflammation affects the tendons that you use to bend your wrist and move your hand up. Inflammation occurs in the lower part of the upper arm bone (humerus), where the tendons connect to the bone (lateral epicondyle). Tennis elbow often affects people who play tennis, but anyone may get the condition from repeatedly extending the wrist or turning the forearm. What are the causes? This condition is usually caused by repeatedly extending the wrist, turning the forearm, and using the hands. It can result from sports or work that requires repetitive forearm movements. In some cases, it may be caused by a sudden injury. What increases the risk? You are more likely to develop tennis elbow if you play tennis or another racket sport. You also have a higher risk if you frequently use your hands for work. Besides people who play tennis, others at greater risk include:  Musicians.  Carpenters, painters, and plumbers.  Cooks.  Cashiers.  People who work in [employer].  Architect workers.  Butchers.  People who use computers. What are the signs or symptoms? Symptoms of this condition include:  Pain and tenderness in the forearm and the outer part of the elbow. Pain may be felt only when using the arm, or it may be there all the time.  A burning feeling that starts in the elbow and spreads down the forearm.  A weak grip in the hand. How is this diagnosed? This condition may be diagnosed based on:  Your symptoms and medical history.  A physical exam.  X-rays.  MRI. How is this treated? Resting and icing your arm is often the first treatment. Your health care provider may also recommend:  Medicines  to reduce pain and inflammation. These may be in the form of a pill, topical gels, or shots of a steroid medicine (cortisone).  An elbow strap to reduce stress on the area.  Physical therapy. This may include massage or exercises.  An elbow brace to restrict the movements that cause symptoms. If these treatments do not help relieve your symptoms, your health care provider may recommend surgery to remove damaged muscle and reattach healthy muscle to bone. Follow these instructions at home: Activity  Rest your elbow and wrist and avoid activities that cause symptoms, as told by your health care provider.  Do physical therapy exercises as instructed.  If you lift an object, lift it with your palm facing up. This reduces stress on your elbow. Lifestyle  If your tennis elbow is caused by sports, check your equipment and make sure that: ? You are using it correctly. ? It is the best fit for you.  If your tennis elbow is caused by work or computer use, take frequent breaks to stretch your arm. Talk with your manager about ways to manage your condition at work. If you have a brace:  Wear the brace or strap as told by your health care provider. Remove it only as told by your health care provider.  Loosen the brace if your fingers tingle, become numb, or turn cold and blue.  Keep the brace clean.  If the brace is not waterproof, ask if you may remove it for bathing. If you must keep the  brace on while bathing: ? Do not let it get wet. ? Cover it with a watertight covering when you take a bath or a shower. General instructions   If directed, put ice on the painful area: ? Put ice in a plastic bag. ? Place a towel between your skin and the bag. ? Leave the ice on for 20 minutes, 2-3 times a day.  Take over-the-counter and prescription medicines only as told by your health care provider.  Keep all follow-up visits as told by your health care provider. This is important. Contact a  health care provider if:  You have pain that gets worse or does not get better with treatment.  You have numbness or weakness in your forearm, hand, or fingers. Summary  Tennis elbow (lateral epicondylitis) is inflammation of tendons in your outer forearm, near your elbow.  Common symptoms include pain and tenderness in your forearm and the outer part of your elbow.  This condition is usually caused by repeatedly extending your wrist, turning your forearm, and using your hands.  The first treatment is often resting and icing your arm to relieve symptoms. Further treatment may include taking medicine, getting physical therapy, wearing a brace or strap, or having surgery. This information is not intended to replace advice given to you by your health care provider. Make sure you discuss any questions you have with your health care provider. Document Released: 07/20/2005 Document Revised: 05/04/2017 Document Reviewed: 05/04/2017 Elsevier Interactive Patient Education  2019 [employer].

## 2019-01-24 ENCOUNTER — Other Ambulatory Visit: Payer: Self-pay

## 2019-01-25 ENCOUNTER — Encounter: Payer: Self-pay | Admitting: Family Medicine

## 2019-01-25 ENCOUNTER — Ambulatory Visit: Payer: Medicaid Other | Admitting: Family Medicine

## 2019-01-25 VITALS — BP 119/74 | HR 101 | Temp 97.8°F | Ht 64.0 in | Wt 216.0 lb

## 2019-01-25 DIAGNOSIS — R5383 Other fatigue: Secondary | ICD-10-CM | POA: Diagnosis not present

## 2019-01-25 DIAGNOSIS — E038 Other specified hypothyroidism: Secondary | ICD-10-CM | POA: Diagnosis not present

## 2019-01-25 DIAGNOSIS — E063 Autoimmune thyroiditis: Secondary | ICD-10-CM

## 2019-01-25 NOTE — Patient Instructions (Addendum)
You had labs performed today.  You will be contacted with the results of the labs once they are available, usually in the next 3 business days for routine lab work.  If you had a pap smear or biopsy performed, expect to be contacted in about 7-10 days.  Fatigue If you have fatigue, you feel tired all the time and have a lack of energy or a lack of motivation. Fatigue may make it difficult to start or complete tasks because of exhaustion. In general, occasional or mild fatigue is often a normal response to activity or life. However, long-lasting (chronic) or extreme fatigue may be a symptom of a medical condition. Follow these instructions at home: General instructions  Watch your fatigue for any changes.  Go to bed and get up at the same time every day.  Avoid fatigue by pacing yourself during the day and getting enough sleep at night.  Maintain a healthy weight. Medicines  Take over-the-counter and prescription medicines only as told by your health care provider.  Take a multivitamin, if told by your health care provider.  Do not use herbal or dietary supplements unless they are approved by your health care provider. Activity   Exercise regularly, as told by your health care provider.  Use or practice techniques to help you relax, such as yoga, tai chi, meditation, or massage therapy. Eating and drinking   Avoid heavy meals in the evening.  Eat a well-balanced diet, which includes lean proteins, whole grains, plenty of fruits and vegetables, and low-fat dairy products.  Avoid consuming too much caffeine.  Avoid the use of alcohol.  Drink enough fluid to keep your urine pale yellow. Lifestyle  Change situations that cause you stress. Try to keep your work and personal schedule in balance.  Do not use any products that contain nicotine or tobacco, such as cigarettes and e-cigarettes. If you need help quitting, ask your health care provider.  Do not use drugs. Contact a  health care provider if:  Your fatigue does not get better.  You have a fever.  You suddenly lose or gain weight.  You have headaches.  You have trouble falling asleep or sleeping through the night.  You feel angry, guilty, anxious, or sad.  You are unable to have a bowel movement (constipation).  Your skin is dry.  You have swelling in your legs or another part of your body. Get help right away if:  You feel confused.  Your vision is blurry.  You feel faint or you pass out.  You have a severe headache.  You have severe pain in your abdomen, your back, or the area between your waist and hips (pelvis).  You have chest pain, shortness of breath, or an irregular or fast heartbeat.  You are unable to urinate, or you urinate less than normal.  You have abnormal bleeding, such as bleeding from the rectum, vagina, nose, lungs, or nipples.  You vomit blood.  You have thoughts about hurting yourself or others. If you ever feel like you may hurt yourself or others, or have thoughts about taking your own life, get help right away. You can go to your nearest emergency department or call:  Your local emergency services (911 in the [place]  A suicide crisis helpline, such as the National Suicide Prevention Lifeline at 1-800-273-8255. This is open 24 hours a day. Summary  If you have fatigue, you feel tired all the time and have a lack of energy or a lack of   motivation.  Fatigue may make it difficult to start or complete tasks because of exhaustion.  Long-lasting (chronic) or extreme fatigue may be a symptom of a medical condition.  Exercise regularly, as told by your health care provider.  Change situations that cause you stress. Try to keep your work and personal schedule in balance. This information is not intended to replace advice given to you by your health care provider. Make sure you discuss any questions you have with your health care provider. Document Released:  05/17/2007 Document Revised: 04/14/2017 Document Reviewed: 04/14/2017 Elsevier Interactive Patient Education  2019 [employer] Inc[employer]  

## 2019-01-25 NOTE — Progress Notes (Signed)
Subjective: CC: f/u thyroid PCP: Julie Norlander, DO JJH:ERDE [name] is a 48 y.o. female presenting to clinic today for:  1.  Hashimoto's thyroiditis Patient here for interval follow-up on hypothyroidism.  She is compliant with Synthroid 175 mcg every morning.  Her last TSH was within normal range.  Over the last couple of weeks she has noticed decreased energy and perhaps some intermittent muscle aching.  Denies any fevers, chills, cough.  Denies tremor, change in voice, difficulty swallowing, diarrhea, constipation, change in weight. No biotin use.   ROS: Per HPI  No Known Allergies Past Medical History:  Diagnosis Date  . Allergy   . Anxiety   . Asthma   . Blood transfusion without reported diagnosis   . Depression   . GERD (gastroesophageal reflux disease)   . Thyroid disease     Current Outpatient Medications:  .  [START ON 02/01/2019] amphetamine-dextroamphetamine (ADDERALL) 10 MG tablet, Take 20mg  every morning and 10mg  every afternoon, Disp: 90 tablet, Rfl: 0 .  buPROPion (WELLBUTRIN XL) 150 MG 24 hr tablet, Take 1 tablet (150 mg total) by mouth daily., Disp: 90 tablet, Rfl: 1 .  famotidine (PEPCID) 20 MG tablet, Take 20 mg by mouth daily., Disp: , Rfl:  .  FISH OIL-KRILL OIL PO, Take by mouth., Disp: , Rfl:  .  levothyroxine (SYNTHROID) 175 MCG tablet, Take 1 tablet (175 mcg total) by mouth daily before breakfast., Disp: 90 tablet, Rfl: 1 .  loratadine (CLARITIN) 10 MG tablet, Take 10 mg by mouth daily., Disp: , Rfl:  .  Multiple Vitamin (MULTIVITAMIN) tablet, Take 1 tablet by mouth daily., Disp: , Rfl:  .  amphetamine-dextroamphetamine (ADDERALL) 10 MG tablet, Take 20mg  every morning and 10mg  every afternoon, Disp: 90 tablet, Rfl: 0 .  amphetamine-dextroamphetamine (ADDERALL) 10 MG tablet, Take 20mg  every morning and 10mg  every afternoon, Disp: 90 tablet, Rfl: 0 .  OVER THE COUNTER MEDICATION, Vitamin D 3 2000 IU one capsule daily., Disp: , Rfl:  .  sucralfate  (CARAFATE) 1 GM/10ML suspension, Take 10 mLs (1 g total) by mouth 3 (three) times daily as needed. (Patient not taking: Reported on 01/25/2019), Disp: 420 mL, Rfl: 0 Social History   Socioeconomic History  . Marital status: Single    Spouse name: Not on file  . Number of children: Not on file  . Years of education: Not on file  . Highest education level: Not on file  Occupational History  . Not on file  Social Needs  . Financial resource strain: Not on file  . Food insecurity    Worry: Not on file    Inability: Not on file  . Transportation needs    Medical: Not on file    Non-medical: Not on file  Tobacco Use  . Smoking status: Current Every Day Smoker    Packs/day: 0.20    Years: 15.00    Pack years: 3.00    Types: Cigarettes  . Smokeless tobacco: Never Used  Substance and Sexual Activity  . Alcohol use: Yes    Frequency: Never    Comment: Rarely  . Drug use: Never  . Sexual activity: Yes    Birth control/protection: I.U.D.  Lifestyle  . Physical activity    Days per week: Not on file    Minutes per session: Not on file  . Stress: Not on file  Relationships  . Social Herbalist on phone: Not on file    Gets together: Not on file  Attends religious service: Not on file    Active member of club or organization: Not on file    Attends meetings of clubs or organizations: Not on file    Relationship status: Not on file  . Intimate partner violence    Fear of current or ex partner: Not on file    Emotionally abused: Not on file    Physically abused: Not on file    Forced sexual activity: Not on file  Other Topics Concern  . Not on file  Social History Narrative  . Not on file   Family History  Problem Relation Age of Onset  . Cancer Mother   . COPD Mother   . Cancer Maternal Grandmother   . COPD Maternal Grandmother   . Colon cancer Neg Hx   . Esophageal cancer Neg Hx   . Rectal cancer Neg Hx   . Stomach cancer Neg Hx     Objective: Office  vital signs reviewed. BP 119/74   Pulse (!) 101   Temp 97.8 F (36.6 C) (Oral)   Ht 5\' 4"  (1.626 m)   Wt 216 lb (98 kg)   BMI 37.08 kg/m   Physical Examination:  General: Awake, alert, well nourished, obese. No acute distress HEENT: Normal    Neck: No masses palpated. No lymphadenopathy; no thyromegaly or palpable thyroid nodules    Eyes: Sclera white.  No exophthalmos Cardio: regular rate and rhythm, S1S2 heard, no murmurs appreciated Pulm: clear to auscultation bilaterally, no wheezes, rhonchi or rales; normal work of breathing on room air Extremities: warm, well perfused, No edema, cyanosis or clubbing; +2 pulses bilaterally MSK: Normal gait and station Skin: dry; intact; no rashes or lesions; normal temperature Neuro: No tremor  Assessment/ Plan: 48 y.o. female   1. Hypothyroidism due to Hashimoto's thyroiditis Somewhat symptomatic with decreased energy.  Check thyroid panel.  We will adjust the Synthroid based on today's labs - Thyroid Panel With TSH  2. Fatigue, unspecified type Uncertain etiology.  Potentially related to undertreated thyroid.  Check CBC, B12 and BMP. - CBC - Vitamin E08 - Basic Metabolic Panel   Orders Placed This Encounter  Procedures  . Thyroid Panel With TSH  . CBC  . Vitamin B12  . Basic Metabolic Panel   No orders of the defined types were placed in this encounter.    Julie Norlander, DO [place] [phone]

## 2019-01-26 LAB — THYROID PANEL WITH TSH
Free Thyroxine Index: 2.8 (ref 1.2–4.9)
T3 Uptake Ratio: 25 % (ref 24–39)
T4, Total: 11.1 ug/dL (ref 4.5–12.0)
TSH: 0.145 u[IU]/mL — ABNORMAL LOW (ref 0.450–4.500)

## 2019-02-21 ENCOUNTER — Encounter: Payer: Self-pay | Admitting: Family Medicine

## 2019-02-21 ENCOUNTER — Ambulatory Visit: Payer: Medicaid Other | Admitting: Family Medicine

## 2019-02-21 ENCOUNTER — Other Ambulatory Visit: Payer: Self-pay

## 2019-02-21 VITALS — BP 125/70 | HR 85 | Temp 98.9°F | Ht 64.0 in | Wt 218.4 lb

## 2019-02-21 DIAGNOSIS — Z3009 Encounter for other general counseling and advice on contraception: Secondary | ICD-10-CM | POA: Diagnosis not present

## 2019-02-21 NOTE — Progress Notes (Signed)
BP 125/70   Pulse 85   Temp 98.9 F (37.2 C) (Other (Comment))   Ht 5\' 4"  (1.626 m)   Wt 218 lb 6.4 oz (99.1 kg)   BMI 37.49 kg/m    Subjective:   Patient ID: Julie Alvarado, female    DOB: 1971-04-16, 48 y.o.   MRN: [id number]  HPI: Julie Alvarado is a 48 y.o. female presenting on 02/21/2019 for discuss removing mirena   HPI Patient is coming in today to discuss Mirena.  She has had a Mirena for just over 5 years as of April of this year.  She mainly has the Mirena now for period control because she is not currently sexually active and has not been sexually active in over a year especially with the coronavirus right now.  She does not plan to become sexually active anytime in the near future.  She is due for a Pap smear as well and would like to do both at the same time.  With recommendations and guidelines that the Mirena only last 5 years I counseled her in getting it replaced before she become sexually active anytime in the near future and recommended to come in sooner if that changes.  Since she has good.  Control on it right now anyways and is happy with that she would like to continue with that for now until that time.  Relevant past medical, surgical, family and social history reviewed and updated as indicated. Interim medical history since our last visit reviewed. Allergies and medications reviewed and updated.  Review of Systems  Constitutional: Negative for chills and fever.  Eyes: Negative for visual disturbance.  Respiratory: Negative for chest tightness and shortness of breath.   Cardiovascular: Negative for chest pain and leg swelling.  Gastrointestinal: Negative for abdominal pain.  Genitourinary: Negative for menstrual problem, vaginal bleeding, vaginal discharge and vaginal pain.  Musculoskeletal: Negative for back pain and gait problem.  Skin: Negative for rash.  Neurological: Negative for light-headedness and headaches.  Psychiatric/Behavioral: Negative for  agitation and behavioral problems.  All other systems reviewed and are negative.   Per HPI unless specifically indicated above   Allergies as of 02/21/2019   No Known Allergies     Medication List       Accurate as of February 21, 2019  2:39 PM. If you have any questions, ask your nurse or doctor.        amphetamine-dextroamphetamine 10 MG tablet Commonly known as: Adderall Take 20mg  every morning and 10mg  every afternoon   amphetamine-dextroamphetamine 10 MG tablet Commonly known as: Adderall Take 20mg  every morning and 10mg  every afternoon   amphetamine-dextroamphetamine 10 MG tablet Commonly known as: Adderall Take 20mg  every morning and 10mg  every afternoon   buPROPion 150 MG 24 hr tablet Commonly known as: Wellbutrin XL Take 1 tablet (150 mg total) by mouth daily.   famotidine 20 MG tablet Commonly known as: PEPCID Take 20 mg by mouth daily.   FISH OIL-KRILL OIL PO Take by mouth.   levothyroxine 175 MCG tablet Commonly known as: SYNTHROID Take 1 tablet (175 mcg total) by mouth daily before breakfast.   loratadine 10 MG tablet Commonly known as: CLARITIN Take 10 mg by mouth daily.   multivitamin tablet Take 1 tablet by mouth daily.   OVER THE COUNTER MEDICATION Vitamin D 3 2000 IU one capsule daily.   sucralfate 1 GM/10ML suspension Commonly known as: Carafate Take 10 mLs (1 g total) by mouth 3 (three) times daily as needed.  Objective:   BP 125/70   Pulse 85   Temp 98.9 F (37.2 C) (Other (Comment))   Ht 5\' 4"  (1.626 m)   Wt 218 lb 6.4 oz (99.1 kg)   BMI 37.49 kg/m   Wt Readings from Last 3 Encounters:  02/21/19 218 lb 6.4 oz (99.1 kg)  01/25/19 216 lb (98 kg)  08/29/18 223 lb (101.2 kg)    Physical Exam Vitals signs and nursing note reviewed.  Constitutional:      General: She is not in acute distress.    Appearance: She is well-developed. She is not diaphoretic.  Neurological:     Mental Status: She is alert and oriented to  person, place, and time.     Coordination: Coordination normal.  Psychiatric:        Behavior: Behavior normal.     Assessment & Plan:   Problem List Items Addressed This Visit    None    Visit Diagnoses    Counseling for birth control regarding intrauterine device (IUD)    -  Primary      Patient would like to continue her birth control for now and she would like to hold off on it.  She also hold off on rescheduling for her Pap smear as well.  She is not sexually active and has no plans to be but will get it changed before if that changes. Follow up plan: Return in about 6 months (around 08/24/2019), or if symptoms worsen or fail to improve, for Mirena and Pap smear.  Counseling provided for all of the vaccine components No orders of the defined types were placed in this encounter.   Caryl Pina, MD [facility] 02/21/2019, 2:39 PM

## 2019-02-27 ENCOUNTER — Telehealth: Payer: Self-pay | Admitting: Family Medicine

## 2019-04-03 ENCOUNTER — Encounter: Payer: Self-pay | Admitting: Family Medicine

## 2019-04-04 ENCOUNTER — Other Ambulatory Visit: Payer: Self-pay

## 2019-04-05 ENCOUNTER — Encounter: Payer: Self-pay | Admitting: Family Medicine

## 2019-04-05 ENCOUNTER — Ambulatory Visit (INDEPENDENT_AMBULATORY_CARE_PROVIDER_SITE_OTHER): Payer: Medicaid Other | Admitting: Family Medicine

## 2019-04-05 VITALS — BP 112/66 | HR 78 | Temp 98.6°F | Ht 64.0 in | Wt 221.0 lb

## 2019-04-05 DIAGNOSIS — E038 Other specified hypothyroidism: Secondary | ICD-10-CM | POA: Diagnosis not present

## 2019-04-05 DIAGNOSIS — Z23 Encounter for immunization: Secondary | ICD-10-CM

## 2019-04-05 DIAGNOSIS — E063 Autoimmune thyroiditis: Secondary | ICD-10-CM | POA: Diagnosis not present

## 2019-04-05 DIAGNOSIS — F900 Attention-deficit hyperactivity disorder, predominantly inattentive type: Secondary | ICD-10-CM

## 2019-04-05 MED ORDER — AMPHETAMINE-DEXTROAMPHETAMINE 10 MG PO TABS: ORAL_TABLET | ORAL | 0 refills | Status: DC

## 2019-04-05 NOTE — Progress Notes (Signed)
Subjective: CC: f/u thyroid PCP: Janora Norlander, DO [id number] Julie Alvarado is a 48 y.o. female presenting to clinic today for:  1.  Hashimoto's thyroiditis Patient here for interval follow-up on hypothyroidism.  She is compliant with Synthroid 175 mcg every morning.  Her last TSH was suppressed.  She reports that she did find that her vitamin contained quite a bit of biotin in it and she expects that this is what altered the last lab value.  She denies any tremors, heart palpitations, difficulty with sleep, unplanned weight loss or change in bowel habits.  2.  ADHD predominantly inattentive Patient was last seen in May via telephone visit.  At that time she was doing well.  Today she notes that she continues to do well with Adderall 20 mg every morning and 10 mg every afternoon.  She does have dry mouth but denies any constipation, heart palpitations, insomnia or increased anxiety.  She hydrates fairly well but notes it is not as good now that she has to wear a mask for COVID.  ROS: Per HPI  No Known Allergies Past Medical History:  Diagnosis Date  . Allergy   . Anxiety   . Asthma   . Blood transfusion without reported diagnosis   . Depression   . GERD (gastroesophageal reflux disease)   . Thyroid disease     Current Outpatient Medications:  .  amphetamine-dextroamphetamine (ADDERALL) 10 MG tablet, Take 20mg  every morning and 10mg  every afternoon, Disp: 90 tablet, Rfl: 0 .  amphetamine-dextroamphetamine (ADDERALL) 10 MG tablet, Take 20mg  every morning and 10mg  every afternoon, Disp: 90 tablet, Rfl: 0 .  amphetamine-dextroamphetamine (ADDERALL) 10 MG tablet, Take 20mg  every morning and 10mg  every afternoon, Disp: 90 tablet, Rfl: 0 .  buPROPion (WELLBUTRIN XL) 150 MG 24 hr tablet, Take 1 tablet (150 mg total) by mouth daily., Disp: 90 tablet, Rfl: 1 .  famotidine (PEPCID) 20 MG tablet, Take 20 mg by mouth daily., Disp: , Rfl:  .  FISH OIL-KRILL OIL PO, Take by mouth., Disp: , Rfl:  .   levothyroxine (SYNTHROID) 175 MCG tablet, Take 1 tablet (175 mcg total) by mouth daily before breakfast., Disp: 90 tablet, Rfl: 1 .  loratadine (CLARITIN) 10 MG tablet, Take 10 mg by mouth daily., Disp: , Rfl:  .  Multiple Vitamin (MULTIVITAMIN) tablet, Take 1 tablet by mouth daily., Disp: , Rfl:  .  OVER THE COUNTER MEDICATION, Vitamin D 3 2000 IU one capsule daily., Disp: , Rfl:  .  sucralfate (CARAFATE) 1 GM/10ML suspension, Take 10 mLs (1 g total) by mouth 3 (three) times daily as needed., Disp: 420 mL, Rfl: 0 Social History   Socioeconomic History  . Marital status: Single    Spouse name: Not on file  . Number of children: Not on file  . Years of education: Not on file  . Highest education level: Not on file  Occupational History  . Not on file  Social Needs  . Financial resource strain: Not on file  . Food insecurity    Worry: Not on file    Inability: Not on file  . Transportation needs    Medical: Not on file    Non-medical: Not on file  Tobacco Use  . Smoking status: Current Every Day Smoker    Packs/day: 0.20    Years: 15.00    Pack years: 3.00    Types: Cigarettes  . Smokeless tobacco: Never Used  Substance and Sexual Activity  . Alcohol use: Yes  Frequency: Never    Comment: Rarely  . Drug use: Never  . Sexual activity: Yes    Birth control/protection: I.U.D.  Lifestyle  . Physical activity    Days per week: Not on file    Minutes per session: Not on file  . Stress: Not on file  Relationships  . Social Herbalist on phone: Not on file    Gets together: Not on file    Attends religious service: Not on file    Active member of club or organization: Not on file    Attends meetings of clubs or organizations: Not on file    Relationship status: Not on file  . Intimate partner violence    Fear of current or ex partner: Not on file    Emotionally abused: Not on file    Physically abused: Not on file    Forced sexual activity: Not on file  Other  Topics Concern  . Not on file  Social History Narrative  . Not on file   Family History  Problem Relation Age of Onset  . Cancer Mother   . COPD Mother   . Cancer Maternal Grandmother   . COPD Maternal Grandmother   . Colon cancer Neg Hx   . Esophageal cancer Neg Hx   . Rectal cancer Neg Hx   . Stomach cancer Neg Hx     Objective: Office vital signs reviewed. BP 112/66   Pulse 78   Temp 98.6 F (37 C) (Temporal)   Ht 5\' 4"  (1.626 m)   Wt 221 lb (100.2 kg)   BMI 37.93 kg/m   Physical Examination:  General: Awake, alert, well nourished, obese. No acute distress HEENT: Normal    Neck: No masses palpated. No lymphadenopathy; no thyromegaly or palpable thyroid nodules    Eyes: Sclera white.  No exophthalmos Cardio: regular rate and rhythm, S1S2 heard, no murmurs appreciated Pulm: clear to auscultation bilaterally, no wheezes, rhonchi or rales; normal work of breathing on room air Extremities: warm, well perfused, No edema, cyanosis or clubbing; +2 pulses bilaterally Skin: dry; intact; no rashes or lesions; normal temperature Neuro: No tremor  Assessment/ Plan: 48 y.o. female   1. Hypothyroidism due to Hashimoto's thyroiditis My plan was to check a thyroid panel but patient had not held her biotin for 2 weeks prior to today's visit.  We will plan to get this at her next visit.  I do suspect that her suppressed TSH was likely reflective of a lab error due to concomitant use of biotin.  She is totally asymptomatic at this point.  Continue current regimen.  Follow-up in 3 months for fasting labs and thyroid check  2. Attention deficit hyperactivity disorder (ADHD), predominantly inattentive type Stable.  The national cardiac database was reviewed and there were no red flags.  Follow-up in 3 months. - amphetamine-dextroamphetamine (ADDERALL) 10 MG tablet; Take 20mg  every morning and 10mg  every afternoon  Dispense: 90 tablet; Refill: 0 - amphetamine-dextroamphetamine (ADDERALL)  10 MG tablet; Take 20mg  every morning and 10mg  every afternoon  Dispense: 90 tablet; Refill: 0 - amphetamine-dextroamphetamine (ADDERALL) 10 MG tablet; Take 20mg  every morning and 10mg  every afternoon  Dispense: 90 tablet; Refill: 0    No orders of the defined types were placed in this encounter.  No orders of the defined types were placed in this encounter.    Janora Norlander, DO [place] [phone]

## 2019-05-03 ENCOUNTER — Encounter: Payer: Self-pay | Admitting: Family Medicine

## 2019-05-03 ENCOUNTER — Other Ambulatory Visit: Payer: Self-pay

## 2019-05-03 ENCOUNTER — Ambulatory Visit (INDEPENDENT_AMBULATORY_CARE_PROVIDER_SITE_OTHER): Payer: Medicaid Other | Admitting: Family Medicine

## 2019-05-03 DIAGNOSIS — J0100 Acute maxillary sinusitis, unspecified: Secondary | ICD-10-CM | POA: Diagnosis not present

## 2019-05-03 MED ORDER — FLUCONAZOLE 150 MG PO TABS: 150.0000 mg | ORAL_TABLET | Freq: Once | ORAL | 0 refills | Status: AC

## 2019-05-03 MED ORDER — AMOXICILLIN-POT CLAVULANATE 875-125 MG PO TABS: 1.0000 | ORAL_TABLET | Freq: Two times a day (BID) | ORAL | 0 refills | Status: DC

## 2019-05-03 NOTE — Progress Notes (Signed)
Subjective:    Patient ID: Julie Alvarado, female    DOB: Apr 10, 1971, 48 y.o.   MRN: [id number]   HPI: Julie Alvarado is a 48 y.o. female presenting for Symptoms include congestion, facial pain, nasal congestion, non productive cough, post nasal drip and sinus pressure. There is no fever, chills, or sweats. Onset of symptoms was a few days ago, gradually worsening since that time.     Depression screen [facility] 2/9 04/05/2019 02/21/2019 01/25/2019 08/29/2018 07/08/2018  Decreased Interest 0 0 0 0 0  Down, Depressed, Hopeless 1 0 0 0 0  PHQ - 2 Score 1 0 0 0 0  Altered sleeping 3 - 0 0 -  Tired, decreased energy 0 - 0 0 -  Change in appetite 1 - 0 0 -  Feeling bad or failure about yourself  0 - 0 0 -  Trouble concentrating 0 - 0 0 -  Moving slowly or fidgety/restless 0 - 0 0 -  Suicidal thoughts 0 - 0 0 -  PHQ-9 Score 5 - 0 0 -  Difficult doing work/chores - - - Not difficult at all -     Relevant past medical, surgical, family and social history reviewed and updated as indicated.  Interim medical history since our last visit reviewed. Allergies and medications reviewed and updated.  ROS:  Review of Systems  Constitutional: Negative for activity change, appetite change, chills and fever.  HENT: Positive for congestion, postnasal drip, rhinorrhea and sinus pressure. Negative for ear discharge, ear pain, hearing loss, nosebleeds, sneezing and trouble swallowing.   Respiratory: Negative for chest tightness and shortness of breath.   Cardiovascular: Negative for chest pain and palpitations.  Skin: Negative for rash.     Social History   Tobacco Use  Smoking Status Current Every Day Smoker  . Packs/day: 0.20  . Years: 15.00  . Pack years: 3.00  . Types: Cigarettes  Smokeless Tobacco Never Used       Objective:     Wt Readings from Last 3 Encounters:  04/05/19 221 lb (100.2 kg)  02/21/19 218 lb 6.4 oz (99.1 kg)  01/25/19 216 lb (98 kg)     Exam deferred. Pt. Harboring due  to COVID 19. Phone visit performed.   Assessment & Plan:   1. Acute maxillary sinusitis, recurrence not specified     Meds ordered this encounter  Medications  . fluconazole (DIFLUCAN) 150 MG tablet    Sig: Take 1 tablet (150 mg total) by mouth once for 1 dose. At onset of symptoms. Repeat at end of treatment    Dispense:  2 tablet    Refill:  0  . amoxicillin-clavulanate (AUGMENTIN) 875-125 MG tablet    Sig: Take 1 tablet by mouth 2 (two) times daily for 10 days. Take all of this medication    Dispense:  20 tablet    Refill:  0    No orders of the defined types were placed in this encounter.     Diagnoses and all orders for this visit:  Acute maxillary sinusitis, recurrence not specified  Other orders -     fluconazole (DIFLUCAN) 150 MG tablet; Take 1 tablet (150 mg total) by mouth once for 1 dose. At onset of symptoms. Repeat at end of treatment -     amoxicillin-clavulanate (AUGMENTIN) 875-125 MG tablet; Take 1 tablet by mouth 2 (two) times daily for 10 days. Take all of this medication    Virtual Visit via telephone Note  I discussed the  limitations, risks, security and privacy concerns of performing an evaluation and management service by telephone and the availability of in person appointments. The patient was identified with two identifiers. Pt.expressed understanding and agreed to proceed. Pt. Is at home. Dr. Livia Snellen is in his office.  Follow Up Instructions:   I discussed the assessment and treatment plan with the patient. The patient was provided an opportunity to ask questions and all were answered. The patient agreed with the plan and demonstrated an understanding of the instructions.   The patient was advised to call back or seek an in-person evaluation if the symptoms worsen or if the condition fails to improve as anticipated.   Total minutes including chart review and phone contact time: 12   Follow up plan: Return if symptoms worsen or fail to improve.   Claretta Fraise, MD [place]

## 2019-05-05 ENCOUNTER — Other Ambulatory Visit: Payer: Self-pay | Admitting: *Deleted

## 2019-05-05 DIAGNOSIS — Z20822 Contact with and (suspected) exposure to covid-19: Secondary | ICD-10-CM

## 2019-05-06 LAB — NOVEL CORONAVIRUS, NAA: SARS-CoV-2, NAA: NOT DETECTED

## 2019-05-15 ENCOUNTER — Encounter: Payer: Self-pay | Admitting: Family Medicine

## 2019-05-15 ENCOUNTER — Other Ambulatory Visit: Payer: Self-pay

## 2019-05-15 ENCOUNTER — Ambulatory Visit (INDEPENDENT_AMBULATORY_CARE_PROVIDER_SITE_OTHER): Payer: Medicaid Other | Admitting: Family Medicine

## 2019-05-15 DIAGNOSIS — J0100 Acute maxillary sinusitis, unspecified: Secondary | ICD-10-CM | POA: Diagnosis not present

## 2019-05-15 DIAGNOSIS — J3089 Other allergic rhinitis: Secondary | ICD-10-CM | POA: Diagnosis not present

## 2019-05-15 MED ORDER — FLUCONAZOLE 150 MG PO TABS: 150.0000 mg | ORAL_TABLET | Freq: Once | ORAL | 0 refills | Status: AC

## 2019-05-15 MED ORDER — CETIRIZINE HCL 10 MG PO TABS: 10.0000 mg | ORAL_TABLET | Freq: Every day | ORAL | 3 refills | Status: DC

## 2019-05-15 MED ORDER — AMOXICILLIN-POT CLAVULANATE 875-125 MG PO TABS: 1.0000 | ORAL_TABLET | Freq: Two times a day (BID) | ORAL | 0 refills | Status: AC

## 2019-05-15 MED ORDER — PREDNISONE 10 MG PO TABS: ORAL_TABLET | ORAL | 0 refills | Status: DC

## 2019-05-15 NOTE — Progress Notes (Addendum)
Established Patient Office Visit  Subjective:  Patient ID: Julie Alvarado, female    DOB: 09-11-1970  Age: 48 y.o. MRN: [id number]  CC: No chief complaint on file.   HPI Julie Alvarado presents for Symptoms include congestion, facial pain, nasal congestion, non productive cough, post nasal drip and sinus pressure. There is no fever, chills, or sweats. Onset of symptoms was a few days ago, gradually worsening since that time. Has chronic allergic rhinits as well. Serves as a trigger.    Past Medical History:  Diagnosis Date  . Allergy   . Anxiety   . Asthma   . Blood transfusion without reported diagnosis   . Depression   . GERD (gastroesophageal reflux disease)   . Thyroid disease     Past Surgical History:  Procedure Laterality Date  . CESAREAN SECTION    . EXTERNAL EAR SURGERY    . TONSILECTOMY, ADENOIDECTOMY, BILATERAL MYRINGOTOMY AND TUBES      Family History  Problem Relation Age of Onset  . Cancer Mother   . COPD Mother   . Cancer Maternal Grandmother   . COPD Maternal Grandmother   . Colon cancer Neg Hx   . Esophageal cancer Neg Hx   . Rectal cancer Neg Hx   . Stomach cancer Neg Hx     Social History   Socioeconomic History  . Marital status: Single    Spouse name: Not on file  . Number of children: Not on file  . Years of education: Not on file  . Highest education level: Not on file  Occupational History  . Not on file  Social Needs  . Financial resource strain: Not on file  . Food insecurity    Worry: Not on file    Inability: Not on file  . Transportation needs    Medical: Not on file    Non-medical: Not on file  Tobacco Use  . Smoking status: Current Every Day Smoker    Packs/day: 0.20    Years: 15.00    Pack years: 3.00    Types: Cigarettes  . Smokeless tobacco: Never Used  Substance and Sexual Activity  . Alcohol use: Yes    Frequency: Never    Comment: Rarely  . Drug use: Never  . Sexual activity: Yes    Birth control/protection:  I.U.D.  Lifestyle  . Physical activity    Days per week: Not on file    Minutes per session: Not on file  . Stress: Not on file  Relationships  . Social Herbalist on phone: Not on file    Gets together: Not on file    Attends religious service: Not on file    Active member of club or organization: Not on file    Attends meetings of clubs or organizations: Not on file    Relationship status: Not on file  . Intimate partner violence    Fear of current or ex partner: Not on file    Emotionally abused: Not on file    Physically abused: Not on file    Forced sexual activity: Not on file  Other Topics Concern  . Not on file  Social History Narrative  . Not on file    Outpatient Medications Prior to Visit  Medication Sig Dispense Refill  . amphetamine-dextroamphetamine (ADDERALL) 10 MG tablet Take 20mg  every morning and 10mg  every afternoon 90 tablet 0  . [START ON 06/05/2019] amphetamine-dextroamphetamine (ADDERALL) 10 MG tablet Take 20mg  every morning and 10mg   every afternoon 90 tablet 0  . amphetamine-dextroamphetamine (ADDERALL) 10 MG tablet Take 20mg  every morning and 10mg  every afternoon 90 tablet 0  . buPROPion (WELLBUTRIN XL) 150 MG 24 hr tablet Take 1 tablet (150 mg total) by mouth daily. 90 tablet 1  . famotidine (PEPCID) 20 MG tablet Take 20 mg by mouth daily.    Marland Kitchen FISH OIL-KRILL OIL PO Take by mouth.    . levothyroxine (SYNTHROID) 175 MCG tablet Take 1 tablet (175 mcg total) by mouth daily before breakfast. 90 tablet 1  . Multiple Vitamin (MULTIVITAMIN) tablet Take 1 tablet by mouth daily.    Marland Kitchen OVER THE COUNTER MEDICATION Vitamin D 3 2000 IU one capsule daily.    . sucralfate (CARAFATE) 1 GM/10ML suspension Take 10 mLs (1 g total) by mouth 3 (three) times daily as needed. 420 mL 0  . loratadine (CLARITIN) 10 MG tablet Take 10 mg by mouth daily.     No facility-administered medications prior to visit.     No Known Allergies  ROS Review of Systems   Constitutional: Negative for activity change, appetite change, chills and fever.  HENT: Positive for congestion, postnasal drip, rhinorrhea and sinus pressure. Negative for ear discharge, ear pain, hearing loss, nosebleeds, sneezing and trouble swallowing.   Respiratory: Negative for chest tightness and shortness of breath.   Cardiovascular: Negative for chest pain and palpitations.  Skin: Negative for rash.      Objective:    Physical Exam  There were no vitals taken for this visit. Wt Readings from Last 3 Encounters:  04/05/19 221 lb (100.2 kg)  02/21/19 218 lb 6.4 oz (99.1 kg)  01/25/19 216 lb (98 kg)   Exam deferred. Pt. Harboring due to COVID 19. Phone visit performed.   Health Maintenance Due  Topic Date Due  . HIV Screening  06/12/1986  . PAP SMEAR-Modifier  06/12/1992    There are no preventive care reminders to display for this patient.  Lab Results  Component Value Date   TSH 0.145 (L) 01/25/2019   Lab Results  Component Value Date   WBC 6.9 05/06/2018   HGB 14.7 05/06/2018   HCT 42.4 05/06/2018   MCV 95 05/06/2018   PLT 295 05/06/2018   Lab Results  Component Value Date   NA 142 05/06/2018   K 4.3 05/06/2018   CO2 22 05/06/2018   GLUCOSE 102 (H) 05/06/2018   BUN 10 05/06/2018   CREATININE 0.88 05/06/2018   CALCIUM 9.3 05/06/2018   No results found for: CHOL No results found for: HDL No results found for: LDLCALC No results found for: TRIG No results found for: CHOLHDL No results found for: HGBA1C    Assessment & Plan:   Problem List Items Addressed This Visit    None    Visit Diagnoses    Acute maxillary sinusitis, recurrence not specified    -  Primary   Relevant Medications   amoxicillin-clavulanate (AUGMENTIN) 875-125 MG tablet   predniSONE (DELTASONE) 10 MG tablet   cetirizine (ZYRTEC) 10 MG tablet   Non-seasonal allergic rhinitis, unspecified trigger          Meds ordered this encounter  Medications  .  amoxicillin-clavulanate (AUGMENTIN) 875-125 MG tablet    Sig: Take 1 tablet by mouth 2 (two) times daily for 10 days. Take all of this medication    Dispense:  20 tablet    Refill:  0  . predniSONE (DELTASONE) 10 MG tablet    Sig: Take 5 daily for  2 days followed by 4,3,2 and 1 for 2 days each.    Dispense:  30 tablet    Refill:  0  . cetirizine (ZYRTEC) 10 MG tablet    Sig: Take 1 tablet (10 mg total) by mouth daily. For allergy symptoms    Dispense:  90 tablet    Refill:  3  . fluconazole (DIFLUCAN) 150 MG tablet    Sig: Take 1 tablet (150 mg total) by mouth once for 1 dose. At onset of symptoms. Repeat at end of treatment    Dispense:  2 tablet    Refill:  0   Virtual Visit via telephone Note  I discussed the limitations, risks, security and privacy concerns of performing an evaluation and management service by telephone and the availability of in person appointments. I also discussed with the patient that there may be a patient responsible charge related to this service. The patient expressed understanding and agreed to proceed. Pt. Is at home. Dr. Livia Snellen is in his office.  Follow Up Instructions:   I discussed the assessment and treatment plan with the patient. The patient was provided an opportunity to ask questions and all were answered. The patient agreed with the plan and demonstrated an understanding of the instructions.   The patient was advised to call back or seek an in-person evaluation if the symptoms worsen or if the condition fails to improve as anticipated.  Total minutes including chart review and phone contact time: 14  Follow-up: Return if symptoms worsen or fail to improve.    Claretta Fraise, MD

## 2019-06-07 ENCOUNTER — Other Ambulatory Visit: Payer: Self-pay | Admitting: Family Medicine

## 2019-06-13 ENCOUNTER — Other Ambulatory Visit: Payer: Self-pay

## 2019-06-13 DIAGNOSIS — Z20822 Contact with and (suspected) exposure to covid-19: Secondary | ICD-10-CM

## 2019-06-15 LAB — NOVEL CORONAVIRUS, NAA: SARS-CoV-2, NAA: NOT DETECTED

## 2019-07-03 ENCOUNTER — Telehealth: Payer: Self-pay | Admitting: Family Medicine

## 2019-07-05 ENCOUNTER — Ambulatory Visit: Payer: Medicaid Other | Admitting: Family Medicine

## 2019-07-11 NOTE — Telephone Encounter (Signed)
Patient has appointment in January.

## 2019-07-31 ENCOUNTER — Ambulatory Visit: Payer: Medicaid Other | Admitting: Family Medicine

## 2019-08-07 ENCOUNTER — Encounter: Payer: Self-pay | Admitting: Family Medicine

## 2019-08-08 ENCOUNTER — Ambulatory Visit (INDEPENDENT_AMBULATORY_CARE_PROVIDER_SITE_OTHER): Payer: Medicaid Other | Admitting: Family Medicine

## 2019-08-08 VITALS — Wt 230.5 lb

## 2019-08-08 DIAGNOSIS — Z6837 Body mass index (BMI) 37.0-37.9, adult: Secondary | ICD-10-CM | POA: Diagnosis not present

## 2019-08-08 DIAGNOSIS — J3089 Other allergic rhinitis: Secondary | ICD-10-CM | POA: Diagnosis not present

## 2019-08-08 DIAGNOSIS — F900 Attention-deficit hyperactivity disorder, predominantly inattentive type: Secondary | ICD-10-CM

## 2019-08-08 DIAGNOSIS — E063 Autoimmune thyroiditis: Secondary | ICD-10-CM

## 2019-08-08 DIAGNOSIS — E038 Other specified hypothyroidism: Secondary | ICD-10-CM | POA: Diagnosis not present

## 2019-08-08 MED ORDER — AMPHETAMINE-DEXTROAMPHETAMINE 10 MG PO TABS: ORAL_TABLET | ORAL | 0 refills | Status: DC

## 2019-08-08 MED ORDER — TRIAMCINOLONE ACETONIDE 55 MCG/ACT NA AERO: 2.0000 | INHALATION_SPRAY | Freq: Every day | NASAL | 12 refills | Status: DC

## 2019-08-08 MED ORDER — BUPROPION HCL ER (XL) 150 MG PO TB24: 150.0000 mg | ORAL_TABLET | Freq: Every day | ORAL | 3 refills | Status: DC

## 2019-08-08 NOTE — Progress Notes (Signed)
Telephone visit  Subjective: CC: ADHD, thyroid follow up PCP: Janora Norlander, DO IFO:YDXA Julie Alvarado is a 49 y.o. female calls for telephone consult today. Patient provides verbal consent for consult held via phone.  Due to COVID-19 pandemic this visit was conducted virtually. This visit type was conducted due to national recommendations for restrictions regarding the COVID-19 Pandemic (e.g. social distancing, sheltering in place) in an effort to limit this patient's exposure and mitigate transmission in our community. All issues noted in this document were discussed and addressed.  A physical exam was not performed with this format.   Location of patient: home Location of provider: Working remotely from home Others present for call: none  1. ADHD Patient reports stability of ADHD symptoms with Adderall.  She is compliant with the medication.  She has not had any unusual weight loss.  She does report quite a vigorous appetite after the medication wears off however.  2. Thyroid disorder Denies any unplanned weight loss or weight gain.  She has not had any hyperthyroid symptoms despite having had suppressed TSH on last laboratory panel.  She is compliant with her Synthroid and has enough for the next couple of weeks.  Of note she did take biotin and will make sure that she discontinues this for 2 weeks prior to her laboratory draw.  3.  Allergic rhinitis Patient was seen for rhinosinusitis last winter.  She was given prednisone, Zyrtec but she did not find that this was especially helpful.  In fact the Zyrtec caused quite a bit of mental fogginess and confusion.  She discontinued this and has been taking Claritin and Allegra instead.  She tried Flonase and felt that it helped some but not substantially.  She continues to have quite a bit of allergy symptoms despite double antihistamine.  Her son recently had an allergy panel and she wants know she can have 1 along with her labs.   ROS: Per  HPI  No Known Allergies Past Medical History:  Diagnosis Date  . Allergy   . Anxiety   . Asthma   . Blood transfusion without reported diagnosis   . Depression   . GERD (gastroesophageal reflux disease)   . Thyroid disease     Current Outpatient Medications:  .  amphetamine-dextroamphetamine (ADDERALL) 10 MG tablet, Take '20mg'$  every morning and '10mg'$  every afternoon, Disp: 90 tablet, Rfl: 0 .  amphetamine-dextroamphetamine (ADDERALL) 10 MG tablet, Take '20mg'$  every morning and '10mg'$  every afternoon, Disp: 90 tablet, Rfl: 0 .  amphetamine-dextroamphetamine (ADDERALL) 10 MG tablet, Take '20mg'$  every morning and '10mg'$  every afternoon, Disp: 90 tablet, Rfl: 0 .  buPROPion (WELLBUTRIN XL) 150 MG 24 hr tablet, Take 1 tablet by mouth once daily, Disp: 90 tablet, Rfl: 0 .  cetirizine (ZYRTEC) 10 MG tablet, Take 1 tablet (10 mg total) by mouth daily. For allergy symptoms, Disp: 90 tablet, Rfl: 3 .  famotidine (PEPCID) 20 MG tablet, Take 20 mg by mouth daily., Disp: , Rfl:  .  FISH OIL-KRILL OIL PO, Take by mouth., Disp: , Rfl:  .  levothyroxine (SYNTHROID) 175 MCG tablet, Take 1 tablet (175 mcg total) by mouth daily before breakfast., Disp: 90 tablet, Rfl: 1 .  Multiple Vitamin (MULTIVITAMIN) tablet, Take 1 tablet by mouth daily., Disp: , Rfl:  .  OVER THE COUNTER MEDICATION, Vitamin D 3 2000 IU one capsule daily., Disp: , Rfl:  .  predniSONE (DELTASONE) 10 MG tablet, Take 5 daily for 2 days followed by 4,3,2 and 1 for 2  days each., Disp: 30 tablet, Rfl: 0 .  sucralfate (CARAFATE) 1 GM/10ML suspension, Take 10 mLs (1 g total) by mouth 3 (three) times daily as needed., Disp: 420 mL, Rfl: 0  Assessment/ Plan: 49 y.o. female   1. Hypothyroidism due to Hashimoto's thyroiditis Asymptomatic.  Will hold biotin for 2 weeks prior to lab draw. - CMP14+EGFR; Future - Thyroid Panel With TSH; Future - Lipid panel; Future  2. Attention deficit hyperactivity disorder (ADHD), predominantly inattentive type The  Narcotic Database has been reviewed.  There were no red flags.   - amphetamine-dextroamphetamine (ADDERALL) 10 MG tablet; Take '20mg'$  every morning and '10mg'$  every afternoon  Dispense: 90 tablet; Refill: 0 - amphetamine-dextroamphetamine (ADDERALL) 10 MG tablet; Take '20mg'$  every morning and '10mg'$  every afternoon  Dispense: 90 tablet; Refill: 0 - amphetamine-dextroamphetamine (ADDERALL) 10 MG tablet; Take '20mg'$  every morning and '10mg'$  every afternoon  Dispense: 90 tablet; Refill: 0  3. BMI 37.0-37.9, adult - Bayer DCA Hb A1c Waived; Future  4. Non-seasonal allergic rhinitis, unspecified trigger Wishes for allergy panel.  I will order allergens for zone 2 which should include our area.  Though unsure if this will modify much of what her plan is.  She is to continue the antihistamines.  We will add Nasacort. - Allergens, Zone 2; Future - Nasacort AQ  Start time: 3:12pm End time: 3:41pm  Total time spent on patient care (including telephone call/ virtual visit): 34 minutes  [place], [place] [phone]

## 2019-08-23 ENCOUNTER — Other Ambulatory Visit: Payer: Self-pay

## 2019-08-23 ENCOUNTER — Ambulatory Visit: Payer: Medicaid Other | Admitting: Family Medicine

## 2019-08-24 ENCOUNTER — Ambulatory Visit: Payer: Medicaid Other | Admitting: Family Medicine

## 2019-08-24 ENCOUNTER — Other Ambulatory Visit: Payer: Self-pay

## 2019-08-24 ENCOUNTER — Encounter: Payer: Self-pay | Admitting: Family Medicine

## 2019-08-24 VITALS — BP 116/82 | HR 86 | Temp 99.1°F | Ht 64.0 in | Wt 228.2 lb

## 2019-08-24 DIAGNOSIS — Z01419 Encounter for gynecological examination (general) (routine) without abnormal findings: Secondary | ICD-10-CM

## 2019-08-24 DIAGNOSIS — Z01411 Encounter for gynecological examination (general) (routine) with abnormal findings: Secondary | ICD-10-CM | POA: Diagnosis not present

## 2019-08-24 DIAGNOSIS — J3089 Other allergic rhinitis: Secondary | ICD-10-CM | POA: Diagnosis not present

## 2019-08-24 DIAGNOSIS — Z30433 Encounter for removal and reinsertion of intrauterine contraceptive device: Secondary | ICD-10-CM

## 2019-08-24 DIAGNOSIS — E038 Other specified hypothyroidism: Secondary | ICD-10-CM | POA: Diagnosis not present

## 2019-08-24 DIAGNOSIS — E063 Autoimmune thyroiditis: Secondary | ICD-10-CM

## 2019-08-24 MED ORDER — LEVONORGESTREL 20 MCG/24HR IU IUD
1.0000 | INTRAUTERINE_SYSTEM | Freq: Once | INTRAUTERINE | Status: AC
  Administered 2019-08-24: 1 via INTRAUTERINE

## 2019-08-24 MED ORDER — LEVOTHYROXINE SODIUM 175 MCG PO TABS: 175.0000 ug | ORAL_TABLET | Freq: Every day | ORAL | 3 refills | Status: DC

## 2019-08-24 NOTE — Progress Notes (Signed)
BP 116/82   Pulse 86   Temp 99.1 F (37.3 C) (Temporal)   Ht _0  (1.626 m)   Wt 228 lb 3.2 oz (103.5 kg)   SpO2 99%   BMI 39.17 kg/m    Subjective:   Patient ID: Julie Alvarado, female    DOB: [birth year], 49 y.o.   MRN: [id number]  HPI: Alejah Aristizabal is a 49 y.o. female presenting on 08/24/2019 for Gynecologic Exam (remove and insert mirena)   HPI Woman and gynecological exam and IUD insertion and removal Patient is coming in today for well woman exam and lab values and reinsert an IUD.  She is mainly using her IUD for period control as she has already had tubal ligation in the past.  She has had her current IUD for almost 7-1/2 years but she is starting to have breakthrough bleeding and periods and so that is why she is coming in and getting a new one today. Patient denies any chest pain, shortness of breath, headaches or vision issues, abdominal complaints, diarrhea, nausea, vomiting, or joint issues.   Hypothyroidism recheck Patient is coming in for thyroid recheck today as well. They deny any issues with hair changes or heat or cold problems or diarrhea or constipation. They deny any chest pain or palpitations. They are currently on levothyroxine 175 micrograms   Relevant past medical, surgical, family and social history reviewed and updated as indicated. Interim medical history since our last visit reviewed. Allergies and medications reviewed and updated.  Review of Systems  Constitutional: Negative for chills and fever.  HENT: Negative for congestion, ear discharge, ear pain and tinnitus.   Eyes: Negative for pain, redness and visual disturbance.  Respiratory: Negative for cough, chest tightness, shortness of breath and wheezing.   Cardiovascular: Negative for chest pain, palpitations and leg swelling.  Gastrointestinal: Negative for abdominal pain, blood in stool, constipation and diarrhea.  Genitourinary: Positive for menstrual problem (Starting to have some breakthrough  bleeding with her IUD being old and passed the 7 years). Negative for difficulty urinating, dysuria and hematuria.  Musculoskeletal: Negative for back pain, gait problem and myalgias.  Skin: Negative for rash.  Neurological: Negative for dizziness, weakness, light-headedness and headaches.  Psychiatric/Behavioral: Negative for agitation, behavioral problems and suicidal ideas.  All other systems reviewed and are negative.   Per HPI unless specifically indicated above   Allergies as of 08/24/2019   No Known Allergies     Medication List       Accurate as of August 24, 2019 11:59 PM. If you have any questions, ask your nurse or doctor.        STOP taking these medications   cetirizine 10 MG tablet Commonly known as: ZYRTEC Stopped by: Fransisca Kaufmann Reed Dady, MD     TAKE these medications   amphetamine-dextroamphetamine 10 MG tablet Commonly known as: Adderall Take 89m every morning and 123mevery afternoon   amphetamine-dextroamphetamine 10 MG tablet Commonly known as: Adderall Take 2031mvery morning and 60m60mery afternoon Start taking on: September 07, 2019   amphetamine-dextroamphetamine 10 MG tablet Commonly known as: Adderall Take 20mg65mry morning and 60mg 19my afternoon Start taking on: October 06, 2019   buPROPion 150 MG 24 hr tablet Commonly known as: WELLBUTRIN XL Take 1 tablet (150 mg total) by mouth daily.   famotidine 20 MG tablet Commonly known as: PEPCID Take 20 mg by mouth daily.   fexofenadine 180 MG tablet Commonly known as: ALLEGRA Take 180 mg by mouth daily.  FISH OIL-KRILL OIL PO Take by mouth.   levothyroxine 175 MCG tablet Commonly known as: SYNTHROID Take 1 tablet (175 mcg total) by mouth daily before breakfast.   loratadine 10 MG tablet Commonly known as: CLARITIN Take 10 mg by mouth daily.   multivitamin tablet Take 1 tablet by mouth daily.   OVER THE COUNTER MEDICATION Vitamin D 3 2000 IU one capsule daily.   sucralfate 1  GM/10ML suspension Commonly known as: Carafate Take 10 mLs (1 g total) by mouth 3 (three) times daily as needed.   triamcinolone 55 MCG/ACT Aero nasal inhaler Commonly known as: NASACORT Place 2 sprays into the nose daily.        Objective:   BP 116/82   Pulse 86   Temp 99.1 F (37.3 C) (Temporal)   Ht '5\' 4"'$  (1.626 m)   Wt 228 lb 3.2 oz (103.5 kg)   SpO2 99%   BMI 39.17 kg/m   Wt Readings from Last 3 Encounters:  08/24/19 228 lb 3.2 oz (103.5 kg)  08/08/19 230 lb 8 oz (104.6 kg)  04/05/19 221 lb (100.2 kg)    Physical Exam Vitals reviewed.  Constitutional:      General: She is not in acute distress.    Appearance: She is well-developed. She is not diaphoretic.  Eyes:     Conjunctiva/sclera: Conjunctivae normal.  Neck:     Thyroid: No thyromegaly.  Cardiovascular:     Rate and Rhythm: Normal rate and regular rhythm.     Heart sounds: Normal heart sounds. No murmur.  Pulmonary:     Effort: Pulmonary effort is normal. No respiratory distress.     Breath sounds: Normal breath sounds. No wheezing.  Chest:     Breasts: Breasts are symmetrical.        Right: No inverted nipple, mass, nipple discharge, skin change or tenderness.        Left: No inverted nipple, mass, nipple discharge, skin change or tenderness.  Abdominal:     General: Bowel sounds are normal. There is no distension.     Palpations: Abdomen is soft.     Tenderness: There is no abdominal tenderness. There is no guarding or rebound.  Genitourinary:    Exam position: Supine.     Labia:        Right: No rash or lesion.        Left: No rash or lesion.      Vagina: Normal.     Cervix: No cervical motion tenderness, discharge or friability.     Uterus: Not deviated, not enlarged, not fixed and not tender.      Adnexa:        Right: No mass or tenderness.         Left: No mass or tenderness.    Musculoskeletal:        General: Normal range of motion.     Cervical back: Neck supple.  Lymphadenopathy:       Cervical: No cervical adenopathy.  Skin:    General: Skin is warm and dry.     Findings: No rash.  Neurological:     Mental Status: She is alert and oriented to person, place, and time.     Coordination: Coordination normal.  Psychiatric:        Behavior: Behavior normal.     IUD removal and reinsertion procedure: Patient was placed in stirrups and use a speculum.  Ring forceps were used to grasp the strings from the previous IUD and  removed without issue.  Patient was prepped with Betadine swabs using cotton balls. Tenaculum was used to grab the anterior cervix. Uterine sound was performed and found to be 7cm in length and anteverted. Cervical dilation was necessary only with the smallest dilator. Mirena was placed using factory device at the correct depth that was measured and deployed without issue. The strings were cut leaving 1.5 cm extra. Patient tolerated procedure well and bleeding was minimal.   Assessment & Plan:   Problem List Items Addressed This Visit      Respiratory   Non-seasonal allergic rhinitis     Endocrine   Hypothyroidism due to Hashimoto's thyroiditis   Relevant Medications   levothyroxine (SYNTHROID) 175 MCG tablet   Other Relevant Orders   TSH (Completed)    Other Visit Diagnoses    Well woman exam with routine gynecological exam    -  Primary   Relevant Orders   CBC with Differential/Platelet (Completed)   CMP14+EGFR (Completed)   Lipid panel (Completed)   TSH (Completed)   PAP IG, CT-NG, RFX HPV ALL   Encounter for IUD removal and reinsertion       Relevant Medications   levonorgestrel (MIRENA) 20 MCG/24HR IUD 1 each (Completed)      Patient will get a schedule on for her mammograms  Mirena inserted without complication the likely will be her last that she needs  We will do blood work today and recheck her thyroid. Follow up plan: Return in about 6 months (around 02/21/2020), or if symptoms worsen or fail to improve, for Thyroid  recheck.  Counseling provided for all of the vaccine components Orders Placed This Encounter  Procedures  . CBC with Differential/Platelet  . CMP14+EGFR  . Lipid panel  . TSH    Caryl Pina, MD [facility] 08/27/2019, 9:31 PM

## 2019-08-25 LAB — IGP,CTNG,RFX APT HPV ALL PTH
Chlamydia, Nuc. Acid Amp: NEGATIVE
Gonococcus by Nucleic Acid Amp: NEGATIVE

## 2019-08-25 LAB — TSH: TSH: 0.914 u[IU]/mL (ref 0.450–4.500)

## 2019-08-25 LAB — CMP14+EGFR
ALT: 22 IU/L (ref 0–32)
AST: 19 IU/L (ref 0–40)
Albumin/Globulin Ratio: 1.4 (ref 1.2–2.2)
Albumin: 4.2 g/dL (ref 3.8–4.8)
Alkaline Phosphatase: 52 IU/L (ref 39–117)
BUN/Creatinine Ratio: 10 (ref 9–23)
BUN: 9 mg/dL (ref 6–24)
Bilirubin Total: 0.4 mg/dL (ref 0.0–1.2)
CO2: 24 mmol/L (ref 20–29)
Calcium: 9 mg/dL (ref 8.7–10.2)
Chloride: 103 mmol/L (ref 96–106)
Creatinine, Ser: 0.9 mg/dL (ref 0.57–1.00)
GFR calc Af Amer: 87 mL/min/{1.73_m2} (ref >59)
GFR calc non Af Amer: 76 mL/min/{1.73_m2} (ref >59)
Globulin, Total: 3 g/dL (ref 1.5–4.5)
Glucose: 90 mg/dL (ref 65–99)
Potassium: 4.6 mmol/L (ref 3.5–5.2)
Sodium: 139 mmol/L (ref 134–144)
Total Protein: 7.2 g/dL (ref 6.0–8.5)

## 2019-08-25 LAB — CBC WITH DIFFERENTIAL/PLATELET
Basophils Absolute: 0.1 10*3/uL (ref 0.0–0.2)
Basos: 1 %
EOS (ABSOLUTE): 0.3 10*3/uL (ref 0.0–0.4)
Eos: 3 %
Hematocrit: 43.9 % (ref 34.0–46.6)
Hemoglobin: 15 g/dL (ref 11.1–15.9)
Immature Grans (Abs): 0 10*3/uL (ref 0.0–0.1)
Immature Granulocytes: 0 %
Lymphocytes Absolute: 2.8 10*3/uL (ref 0.7–3.1)
Lymphs: 33 %
MCH: 33 pg (ref 26.6–33.0)
MCHC: 34.2 g/dL (ref 31.5–35.7)
MCV: 97 fL (ref 79–97)
Monocytes Absolute: 0.8 10*3/uL (ref 0.1–0.9)
Monocytes: 9 %
Neutrophils Absolute: 4.7 10*3/uL (ref 1.4–7.0)
Neutrophils: 54 %
Platelets: 296 10*3/uL (ref 150–450)
RBC: 4.55 x10E6/uL (ref 3.77–5.28)
RDW: 12.6 % (ref 11.7–15.4)
WBC: 8.6 10*3/uL (ref 3.4–10.8)

## 2019-08-25 LAB — LIPID PANEL
Chol/HDL Ratio: 3.8 ratio (ref 0.0–4.4)
Cholesterol, Total: 153 mg/dL (ref 100–199)
HDL: 40 mg/dL (ref >39)
LDL Chol Calc (NIH): 96 mg/dL (ref 0–99)
Triglycerides: 89 mg/dL (ref 0–149)
VLDL Cholesterol Cal: 17 mg/dL (ref 5–40)

## 2019-08-26 LAB — ALLERGENS, ZONE 2
Alternaria Alternata IgE: 0.28 kU/L — AB
Amer Sycamore IgE Qn: 0.13 kU/L — AB
Aspergillus Fumigatus IgE: <0.1 kU/L
Bahia Grass IgE: <0.1 kU/L
Bermuda Grass IgE: <0.1 kU/L
Cat Dander IgE: <0.1 kU/L
Cedar, Mountain IgE: 0.38 kU/L — AB
Cladosporium Herbarum IgE: <0.1 kU/L
Cockroach, American IgE: <0.1 kU/L
Common Silver Birch IgE: <0.1 kU/L
D Farinae IgE: 0.17 kU/L — AB
D Pteronyssinus IgE: 0.19 kU/L — AB
Dog Dander IgE: <0.1 kU/L
Elm, American IgE: <0.1 kU/L
Hickory, White IgE: <0.1 kU/L
Johnson Grass IgE: <0.1 kU/L
Maple/Box Elder IgE: <0.1 kU/L
Mucor Racemosus IgE: <0.1 kU/L
Mugwort IgE Qn: <0.1 kU/L
Nettle IgE: <0.1 kU/L
Oak, White IgE: 0.11 kU/L — AB
Penicillium Chrysogen IgE: <0.1 kU/L
Pigweed, Rough IgE: <0.1 kU/L
Plantain, English IgE: 0.12 kU/L — AB
Ragweed, Short IgE: <0.1 kU/L
Sheep Sorrel IgE Qn: <0.1 kU/L
Stemphylium Herbarum IgE: 0.18 kU/L — AB
Sweet gum IgE RAST Ql: <0.1 kU/L
Timothy Grass IgE: <0.1 kU/L
White Mulberry IgE: <0.1 kU/L

## 2019-09-07 ENCOUNTER — Other Ambulatory Visit: Payer: Self-pay

## 2019-09-07 ENCOUNTER — Ambulatory Visit: Payer: Medicaid Other | Attending: Internal Medicine

## 2019-09-07 DIAGNOSIS — Z20822 Contact with and (suspected) exposure to covid-19: Secondary | ICD-10-CM

## 2019-09-09 LAB — NOVEL CORONAVIRUS, NAA: SARS-CoV-2, NAA: NOT DETECTED

## 2019-11-20 ENCOUNTER — Other Ambulatory Visit: Payer: Self-pay | Admitting: Family Medicine

## 2019-11-20 DIAGNOSIS — E038 Other specified hypothyroidism: Secondary | ICD-10-CM

## 2019-12-03 ENCOUNTER — Other Ambulatory Visit: Payer: Self-pay | Admitting: Family Medicine

## 2019-12-03 DIAGNOSIS — E038 Other specified hypothyroidism: Secondary | ICD-10-CM

## 2019-12-11 ENCOUNTER — Other Ambulatory Visit: Payer: Self-pay | Admitting: Family Medicine

## 2019-12-11 DIAGNOSIS — E063 Autoimmune thyroiditis: Secondary | ICD-10-CM

## 2020-01-15 ENCOUNTER — Ambulatory Visit: Payer: Medicaid Other | Admitting: Family Medicine

## 2020-01-15 ENCOUNTER — Other Ambulatory Visit: Payer: Self-pay

## 2020-01-15 ENCOUNTER — Encounter: Payer: Self-pay | Admitting: Family Medicine

## 2020-01-15 VITALS — BP 122/78 | HR 85 | Temp 97.8°F | Ht 64.0 in | Wt 227.0 lb

## 2020-01-15 DIAGNOSIS — G8929 Other chronic pain: Secondary | ICD-10-CM

## 2020-01-15 DIAGNOSIS — Z79899 Other long term (current) drug therapy: Secondary | ICD-10-CM

## 2020-01-15 DIAGNOSIS — M25512 Pain in left shoulder: Secondary | ICD-10-CM

## 2020-01-15 DIAGNOSIS — E063 Autoimmune thyroiditis: Secondary | ICD-10-CM

## 2020-01-15 DIAGNOSIS — F900 Attention-deficit hyperactivity disorder, predominantly inattentive type: Secondary | ICD-10-CM

## 2020-01-15 DIAGNOSIS — M778 Other enthesopathies, not elsewhere classified: Secondary | ICD-10-CM

## 2020-01-15 DIAGNOSIS — E038 Other specified hypothyroidism: Secondary | ICD-10-CM

## 2020-01-15 MED ORDER — AMPHETAMINE-DEXTROAMPHETAMINE 10 MG PO TABS: ORAL_TABLET | ORAL | 0 refills | Status: DC

## 2020-01-15 NOTE — Progress Notes (Signed)
Subjective: CC: f/u thyroid PCP: Janora Norlander, DO GLO:VFIE Hamlett is a 49 y.o. female presenting to clinic today for:  1.  Hashimoto's thyroiditis Reports compliance with Synthroid.  No heart palpitations, difficulty swallowing, tremor.  She forgot to hold her biotin before today's appointment  2.  ADHD predominantly inattentive Continues to use Adderall 20 mg every morning and 10 mg every afternoon.  She has dry mouth that is relieved by water and occasionally has constipation that is relieved by dairy.  No chest pain, shortness of breath.  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  3.  Left shoulder/right forearm pain Patient with chronic left-sided shoulder pain that has been ongoing for 6+ years.  She is seeing an orthopedist at [facility] and has had 2 corticosteroid injections in that left shoulder.  She was told that she has a calcification.  She is thinking that she probably needs another corticosteroid but wishes they would go ahead and do the arthroscopy.  She does not take oral NSAIDs secondary to gastritis.  She is taken Tylenol intermittently.  She also reports a forearm pain that seems almost like a tendinitis.  Sometimes she feels weak after repetitive motions in that right forearm.  ROS: Per HPI  No Known Allergies Past Medical History:  Diagnosis Date  . Allergy   . Anxiety   . Asthma   . Blood transfusion without reported diagnosis   . Depression   . GERD (gastroesophageal reflux disease)   . Thyroid disease     Current Outpatient Medications:  .  amphetamine-dextroamphetamine (ADDERALL) 10 MG tablet, Take 20mg  every morning and 10mg  every afternoon, Disp: 90 tablet, Rfl: 0 .  amphetamine-dextroamphetamine (ADDERALL) 10 MG tablet, Take 20mg  every morning and 10mg  every afternoon, Disp: 90 tablet, Rfl: 0 .  amphetamine-dextroamphetamine (ADDERALL) 10 MG tablet, Take 20mg  every morning and 10mg  every afternoon, Disp: 90 tablet, Rfl: 0 .  buPROPion (WELLBUTRIN  XL) 150 MG 24 hr tablet, Take 1 tablet (150 mg total) by mouth daily., Disp: 90 tablet, Rfl: 3 .  famotidine (PEPCID) 20 MG tablet, Take 20 mg by mouth daily., Disp: , Rfl:  .  fexofenadine (ALLEGRA) 180 MG tablet, Take 180 mg by mouth daily., Disp: , Rfl:  .  FISH OIL-KRILL OIL PO, Take by mouth., Disp: , Rfl:  .  levothyroxine (SYNTHROID) 175 MCG tablet, Take 1 tablet (175 mcg total) by mouth daily before breakfast., Disp: 90 tablet, Rfl: 3 .  loratadine (CLARITIN) 10 MG tablet, Take 10 mg by mouth daily., Disp: , Rfl:  .  Multiple Vitamin (MULTIVITAMIN) tablet, Take 1 tablet by mouth daily., Disp: , Rfl:  .  OVER THE COUNTER MEDICATION, Vitamin D 3 2000 IU one capsule daily., Disp: , Rfl:  .  sucralfate (CARAFATE) 1 GM/10ML suspension, Take 10 mLs (1 g total) by mouth 3 (three) times daily as needed., Disp: 420 mL, Rfl: 0 .  triamcinolone (NASACORT) 55 MCG/ACT AERO nasal inhaler, Place 2 sprays into the nose daily., Disp: 1 Inhaler, Rfl: 12 Social History   Socioeconomic History  . Marital status: Single    Spouse name: Not on file  . Number of children: Not on file  . Years of education: Not on file  . Highest education level: Not on file  Occupational History  . Not on file  Tobacco Use  . Smoking status: Current Every Day Smoker    Packs/day: 0.20    Years: 15.00    Pack years: 3.00  Types: Cigarettes  . Smokeless tobacco: Never Used  Substance and Sexual Activity  . Alcohol use: Yes    Comment: Rarely  . Drug use: Never  . Sexual activity: Yes    Birth control/protection: I.U.D.  Other Topics Concern  . Not on file  Social History Narrative  . Not on file   Social Determinants of Health   Financial Resource Strain:   . Difficulty of Paying Living Expenses:   Food Insecurity:   . Worried About Charity fundraiser in the Last Year:   . Arboriculturist in the Last Year:   Transportation Needs:   . Film/video editor (Medical):   Marland Kitchen Lack of Transportation  (Non-Medical):   Physical Activity:   . Days of Exercise per Week:   . Minutes of Exercise per Session:   Stress:   . Feeling of Stress :   Social Connections:   . Frequency of Communication with Friends and Family:   . Frequency of Social Gatherings with Friends and Family:   . Attends Religious Services:   . Active Member of Clubs or Organizations:   . Attends Archivist Meetings:   Marland Kitchen Marital Status:   Intimate Partner Violence:   . Fear of Current or Ex-Partner:   . Emotionally Abused:   Marland Kitchen Physically Abused:   . Sexually Abused:    Family History  Problem Relation Age of Onset  . Cancer Mother   . COPD Mother   . Cancer Maternal Grandmother   . COPD Maternal Grandmother   . Colon cancer Neg Hx   . Esophageal cancer Neg Hx   . Rectal cancer Neg Hx   . Stomach cancer Neg Hx     Objective: Office vital signs reviewed. BP 122/78   Pulse 85   Temp 97.8 F (36.6 C) (Temporal)   Ht 5\' 4"  (1.626 m)   Wt 227 lb (103 kg)   SpO2 97%   BMI 38.96 kg/m   Physical Examination:  General: Awake, alert, well nourished, obese. No acute distress HEENT: Normal    Neck: No masses palpated. No lymphadenopathy; no thyromegaly or palpable thyroid nodules    Eyes: Sclera white.  No exophthalmos Cardio: regular rate and rhythm, S1S2 heard, no murmurs appreciated Pulm: clear to auscultation bilaterally, no wheezes, rhonchi or rales; normal work of breathing on room air MSK: Ambulating independently.  No gross deformities noted of the right forearm. Extremities: warm, well perfused, No edema, cyanosis or clubbing; +2 pulses bilaterally Skin: dry; intact; no rashes or lesions; normal temperature Neuro: No tremor  Assessment/ Plan: 49 y.o. female   1. Attention deficit hyperactivity disorder (ADHD), predominantly inattentive type Stable.  National narcotic database was reviewed and there were no red flags.  UDS and CSC updated.  Last dose greater than 24 hours ago -  ToxASSURE Select 13 (MW), Urine - amphetamine-dextroamphetamine (ADDERALL) 10 MG tablet; Take 20mg  every morning and 10mg  every afternoon  Dispense: 90 tablet; Refill: 0 - amphetamine-dextroamphetamine (ADDERALL) 10 MG tablet; Take 20mg  every morning and 10mg  every afternoon  Dispense: 90 tablet; Refill: 0 - amphetamine-dextroamphetamine (ADDERALL) 10 MG tablet; Take 20mg  every morning and 10mg  every afternoon  Dispense: 90 tablet; Refill: 0  2. Controlled substance agreement signed - ToxASSURE Select 13 (MW), Urine  3. Hypothyroidism due to Hashimoto's thyroiditis Asymptomatic.  Continue current regimen  4. Chronic left shoulder pain I have encouraged her to follow-up with her orthopedist.  In the interim, can use topical Voltaren  gel without worry about gastritis.  Encouraged Tylenol arthritis  5. Forearm tendonitis As above   No orders of the defined types were placed in this encounter.  No orders of the defined types were placed in this encounter.    Janora Norlander, DO [place] [phone]

## 2020-01-18 LAB — TOXASSURE SELECT 13 (MW), URINE

## 2020-04-08 NOTE — Progress Notes (Signed)
Subjective: CC: f/u thyroid, ADHD PCP: Janora Norlander, DO Julie Alvarado is a 49 y.o. female presenting to clinic today for:  1.  Hashimoto's thyroiditis Reports compliance with Synthroid.  No heart palpitations, difficulty swallowing, tremor, change in bowel habits.  She stopped her Biotin 10 days ago.  2.  ADHD predominantly inattentive Prescribed Adderall 20 mg every morning and 10 mg every afternoon.  She has not taken in several days because she hasn't been at work.  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  Does not report any adverse side effects  3.  Left shoulder/right forearm pain Ongoing issues. Now with right sensory changes in digits 4 and 5.  Pain is radiating from forearm up into the right shoulder now.  She saw a chiropractor but he did not think it was her neck.  She has an appointment with [facility] orthopedics this afternoon.   ROS: Per HPI  No Known Allergies Past Medical History:  Diagnosis Date  . Allergy   . Anxiety   . Asthma   . Blood transfusion without reported diagnosis   . Depression   . GERD (gastroesophageal reflux disease)   . Thyroid disease     Current Outpatient Medications:  .  amphetamine-dextroamphetamine (ADDERALL) 10 MG tablet, Take 20mg  every morning and 10mg  every afternoon, Disp: 90 tablet, Rfl: 0 .  amphetamine-dextroamphetamine (ADDERALL) 10 MG tablet, Take 20mg  every morning and 10mg  every afternoon, Disp: 90 tablet, Rfl: 0 .  amphetamine-dextroamphetamine (ADDERALL) 10 MG tablet, Take 20mg  every morning and 10mg  every afternoon, Disp: 90 tablet, Rfl: 0 .  buPROPion (WELLBUTRIN XL) 150 MG 24 hr tablet, Take 1 tablet (150 mg total) by mouth daily., Disp: 90 tablet, Rfl: 3 .  famotidine (PEPCID) 20 MG tablet, Take 20 mg by mouth daily., Disp: , Rfl:  .  fexofenadine (ALLEGRA) 180 MG tablet, Take 180 mg by mouth daily., Disp: , Rfl:  .  FISH OIL-KRILL OIL PO, Take by mouth., Disp: , Rfl:  .  levothyroxine (SYNTHROID) 175  MCG tablet, Take 1 tablet (175 mcg total) by mouth daily before breakfast., Disp: 90 tablet, Rfl: 3 .  Multiple Vitamin (MULTIVITAMIN) tablet, Take 1 tablet by mouth daily., Disp: , Rfl:  .  OVER THE COUNTER MEDICATION, Vitamin D 3 2000 IU one capsule daily., Disp: , Rfl:  .  sucralfate (CARAFATE) 1 GM/10ML suspension, Take 10 mLs (1 g total) by mouth 3 (three) times daily as needed., Disp: 420 mL, Rfl: 0 .  triamcinolone (NASACORT) 55 MCG/ACT AERO nasal inhaler, Place 2 sprays into the nose daily., Disp: 1 Inhaler, Rfl: 12 Social History   Socioeconomic History  . Marital status: Single    Spouse name: Not on file  . Number of children: Not on file  . Years of education: Not on file  . Highest education level: Not on file  Occupational History  . Not on file  Tobacco Use  . Smoking status: Current Every Day Smoker    Packs/day: 0.20    Years: 15.00    Pack years: 3.00    Types: Cigarettes  . Smokeless tobacco: Never Used  Vaping Use  . Vaping Use: Never used  Substance and Sexual Activity  . Alcohol use: Yes    Comment: Rarely  . Drug use: Never  . Sexual activity: Yes    Birth control/protection: I.U.D.  Other Topics Concern  . Not on file  Social History Narrative  . Not on file  Social Determinants of Health   Financial Resource Strain:   . Difficulty of Paying Living Expenses: Not on file  Food Insecurity:   . Worried About Charity fundraiser in the Last Year: Not on file  . Ran Out of Food in the Last Year: Not on file  Transportation Needs:   . Lack of Transportation (Medical): Not on file  . Lack of Transportation (Non-Medical): Not on file  Physical Activity:   . Days of Exercise per Week: Not on file  . Minutes of Exercise per Session: Not on file  Stress:   . Feeling of Stress : Not on file  Social Connections:   . Frequency of Communication with Friends and Family: Not on file  . Frequency of Social Gatherings with Friends and Family: Not on file  .  Attends Religious Services: Not on file  . Active Member of Clubs or Organizations: Not on file  . Attends Archivist Meetings: Not on file  . Marital Status: Not on file  Intimate Partner Violence:   . Fear of Current or Ex-Partner: Not on file  . Emotionally Abused: Not on file  . Physically Abused: Not on file  . Sexually Abused: Not on file   Family History  Problem Relation Age of Onset  . Cancer Mother   . COPD Mother   . COPD Father   . Cancer Maternal Grandmother   . COPD Maternal Grandmother   . Colon cancer Neg Hx   . Esophageal cancer Neg Hx   . Rectal cancer Neg Hx   . Stomach cancer Neg Hx     Objective: Office vital signs reviewed. BP 126/74   Pulse 81   Temp 98 F (36.7 C) (Temporal)   Ht 5\' 4"  (1.626 m)   Wt 228 lb (103.4 kg)   SpO2 98%   BMI 39.14 kg/m   Physical Examination:  General: Awake, alert, well nourished, obese. No acute distress HEENT: Normal. Sclera white.  No exophthalmos Cardio: regular rate and rhythm, S1S2 heard, no murmurs appreciated Pulm: clear to auscultation bilaterally, no wheezes, rhonchi or rales; normal work of breathing on room air Extremities: warm, well perfused, No edema, cyanosis or clubbing; +2 pulses bilaterally Skin: dry; intact; no rashes or lesions; normal temperature Neuro: No tremor  Assessment/ Plan: 49 y.o. female   1. Attention deficit hyperactivity disorder (ADHD), predominantly inattentive type The Narcotic Database has been reviewed.  There were no red flags.   - amphetamine-dextroamphetamine (ADDERALL) 10 MG tablet; Take 20mg  every morning and 10mg  every afternoon  Dispense: 90 tablet; Refill: 0 - amphetamine-dextroamphetamine (ADDERALL) 10 MG tablet; Take 20mg  every morning and 10mg  every afternoon  Dispense: 90 tablet; Refill: 0 - amphetamine-dextroamphetamine (ADDERALL) 10 MG tablet; Take 20mg  every morning and 10mg  every afternoon  Dispense: 90 tablet; Refill: 0  2. Hypothyroidism due to  Hashimoto's thyroiditis Asymptomatic - Thyroid Panel With TSH  3. Chronic left shoulder pain Seeing ortho today  4. Right arm pain Seeing ortho today. Suspect c-spine etiology given presentation.    No orders of the defined types were placed in this encounter.  No orders of the defined types were placed in this encounter.    Janora Norlander, DO [place] [phone]

## 2020-04-09 ENCOUNTER — Encounter: Payer: Self-pay | Admitting: Family Medicine

## 2020-04-09 ENCOUNTER — Other Ambulatory Visit: Payer: Self-pay

## 2020-04-09 ENCOUNTER — Ambulatory Visit (INDEPENDENT_AMBULATORY_CARE_PROVIDER_SITE_OTHER): Payer: 59 | Admitting: Family Medicine

## 2020-04-09 VITALS — BP 126/74 | HR 81 | Temp 98.0°F | Ht 64.0 in | Wt 228.0 lb

## 2020-04-09 DIAGNOSIS — F900 Attention-deficit hyperactivity disorder, predominantly inattentive type: Secondary | ICD-10-CM | POA: Diagnosis not present

## 2020-04-09 DIAGNOSIS — E038 Other specified hypothyroidism: Secondary | ICD-10-CM | POA: Diagnosis not present

## 2020-04-09 DIAGNOSIS — M25512 Pain in left shoulder: Secondary | ICD-10-CM | POA: Diagnosis not present

## 2020-04-09 DIAGNOSIS — G8929 Other chronic pain: Secondary | ICD-10-CM

## 2020-04-09 DIAGNOSIS — E063 Autoimmune thyroiditis: Secondary | ICD-10-CM

## 2020-04-09 DIAGNOSIS — M79601 Pain in right arm: Secondary | ICD-10-CM

## 2020-04-09 MED ORDER — AMPHETAMINE-DEXTROAMPHETAMINE 10 MG PO TABS: ORAL_TABLET | ORAL | 0 refills | Status: DC

## 2020-04-09 NOTE — Patient Instructions (Addendum)
You had labs performed today.  You will be contacted with the results of the labs once they are available, usually in the next 3 business days for routine lab work.  If you have an active my chart account, they will be released to your MyChart.  If you prefer to have these labs released to you via telephone, please let us know.  If you had a pap smear or biopsy performed, expect to be contacted in about 7-10 days.  Congrats on the new house!

## 2020-04-10 LAB — THYROID PANEL WITH TSH
Free Thyroxine Index: 2.4 (ref 1.2–4.9)
T3 Uptake Ratio: 25 % (ref 24–39)
T4, Total: 9.6 ug/dL (ref 4.5–12.0)
TSH: 3.54 u[IU]/mL (ref 0.450–4.500)

## 2020-06-06 ENCOUNTER — Other Ambulatory Visit: Payer: Self-pay | Admitting: Family Medicine

## 2020-06-06 DIAGNOSIS — E038 Other specified hypothyroidism: Secondary | ICD-10-CM

## 2020-06-26 ENCOUNTER — Other Ambulatory Visit: Payer: Self-pay | Admitting: Family Medicine

## 2020-06-26 ENCOUNTER — Encounter: Payer: Self-pay | Admitting: Family Medicine

## 2020-06-26 DIAGNOSIS — F900 Attention-deficit hyperactivity disorder, predominantly inattentive type: Secondary | ICD-10-CM

## 2020-06-26 MED ORDER — AMPHETAMINE-DEXTROAMPHETAMINE 10 MG PO TABS: ORAL_TABLET | ORAL | 0 refills | Status: DC

## 2020-06-26 NOTE — Telephone Encounter (Signed)
I sent her November prescription to [facility] as per her request.  I believe her October prescription has now expired.  Please make sure that she has appropriate follow-up with me for ongoing refills of this class II controlled substance.

## 2020-07-10 ENCOUNTER — Ambulatory Visit: Payer: Self-pay | Admitting: Family Medicine

## 2020-07-11 ENCOUNTER — Encounter: Payer: Self-pay | Admitting: Family Medicine

## 2020-08-16 ENCOUNTER — Ambulatory Visit: Payer: 59 | Admitting: Family Medicine

## 2020-08-19 ENCOUNTER — Ambulatory Visit: Payer: 59 | Admitting: Family Medicine

## 2020-08-19 ENCOUNTER — Other Ambulatory Visit: Payer: Self-pay

## 2020-08-23 ENCOUNTER — Other Ambulatory Visit: Payer: Self-pay

## 2020-08-23 ENCOUNTER — Encounter: Payer: Self-pay | Admitting: Family Medicine

## 2020-08-23 ENCOUNTER — Ambulatory Visit (INDEPENDENT_AMBULATORY_CARE_PROVIDER_SITE_OTHER): Payer: 59 | Admitting: Family Medicine

## 2020-08-23 VITALS — BP 114/72 | HR 84 | Temp 98.7°F | Ht 64.0 in | Wt 227.0 lb

## 2020-08-23 DIAGNOSIS — Z23 Encounter for immunization: Secondary | ICD-10-CM | POA: Diagnosis not present

## 2020-08-23 DIAGNOSIS — M79601 Pain in right arm: Secondary | ICD-10-CM

## 2020-08-23 DIAGNOSIS — F900 Attention-deficit hyperactivity disorder, predominantly inattentive type: Secondary | ICD-10-CM

## 2020-08-23 DIAGNOSIS — E063 Autoimmune thyroiditis: Secondary | ICD-10-CM

## 2020-08-23 DIAGNOSIS — E038 Other specified hypothyroidism: Secondary | ICD-10-CM | POA: Diagnosis not present

## 2020-08-23 MED ORDER — AMPHETAMINE-DEXTROAMPHETAMINE 10 MG PO TABS: ORAL_TABLET | ORAL | 0 refills | Status: DC

## 2020-08-23 MED ORDER — AMPHETAMINE-DEXTROAMPHETAMINE 10 MG PO TABS
ORAL_TABLET | ORAL | 0 refills | Status: DC
Start: 2020-08-23 — End: 2020-11-20

## 2020-08-23 MED ORDER — BUPROPION HCL ER (XL) 150 MG PO TB24: 150.0000 mg | ORAL_TABLET | Freq: Every day | ORAL | 3 refills | Status: DC

## 2020-08-23 NOTE — Patient Instructions (Signed)
Let me know who to send the prescription to.

## 2020-08-23 NOTE — Progress Notes (Signed)
Subjective: CC: ADHD follow-up PCP: Janora Norlander, DO ZOX:WRUE Schumpert is a 50 y.o. female presenting to clinic today for:  1.  ADHD Patient with adult onset ADHD.  She has been stable on Adderall 10 mg twice daily.  Unfortunately she been out of her medication and symptoms have been exacerbated since that time.  This is what caused her to miss her last appointment.  She is apologetic about that.  She actually forgot about today's appointment as well until she was called  2. arm pain Patient reports that she been having some issues with chronic back pain and bilateral upper extremity pain.  They think that the upper extremity pain may be coming from her neck but she is also due to have a carpal tunnel surgery on her wrists.  She was started on gabapentin 300 mg 3 times daily and again this is contributed some to her foggy headedness but overall she seems to be seeing improvement from musculoskeletal Standpoint  3. hypothyroidism Patient is compliant with her Synthroid.  No reports of tremor, heart palpitations, change in bowel habits   ROS: Per HPI  No Known Allergies Past Medical History:  Diagnosis Date   Allergy    Anxiety    Asthma    Blood transfusion without reported diagnosis    Depression    GERD (gastroesophageal reflux disease)    Thyroid disease     Current Outpatient Medications:    amphetamine-dextroamphetamine (ADDERALL) 10 MG tablet, Take $RemoveBefo'20mg'HZkyTofJHtg$  every morning and $RemoveBef'10mg'HtPAwStbiG$  every afternoon, Disp: 90 tablet, Rfl: 0   amphetamine-dextroamphetamine (ADDERALL) 10 MG tablet, Take $RemoveBefo'20mg'JvLsJkJEDLm$  every morning and $RemoveBef'10mg'OWMFHaVuYI$  every afternoon, Disp: 90 tablet, Rfl: 0   amphetamine-dextroamphetamine (ADDERALL) 10 MG tablet, Take $RemoveBefo'20mg'hHtSJgHKiTJ$  every morning and $RemoveBef'10mg'zNYUjNyalL$  every afternoon, Disp: 90 tablet, Rfl: 0   buPROPion (WELLBUTRIN XL) 150 MG 24 hr tablet, Take 1 tablet (150 mg total) by mouth daily., Disp: 90 tablet, Rfl: 3   famotidine (PEPCID) 20 MG tablet, Take 20 mg by mouth daily.,  Disp: , Rfl:    fexofenadine (ALLEGRA) 180 MG tablet, Take 180 mg by mouth daily., Disp: , Rfl:    FISH OIL-KRILL OIL PO, Take by mouth., Disp: , Rfl:    gabapentin (NEURONTIN) 300 MG capsule, Take 300 mg by mouth 3 (three) times daily., Disp: , Rfl:    levothyroxine (SYNTHROID) 175 MCG tablet, TAKE 1 TABLET EVERY DAY BEFORE BREAKFAST, Disp: 90 tablet, Rfl: 3   Multiple Vitamin (MULTIVITAMIN) tablet, Take 1 tablet by mouth daily., Disp: , Rfl:    OVER THE COUNTER MEDICATION, Vitamin D 3 2000 IU one capsule daily., Disp: , Rfl:    triamcinolone (NASACORT) 55 MCG/ACT AERO nasal inhaler, Place 2 sprays into the nose daily., Disp: 1 Inhaler, Rfl: 12   sucralfate (CARAFATE) 1 GM/10ML suspension, Take 10 mLs (1 g total) by mouth 3 (three) times daily as needed. (Patient not taking: Reported on 08/23/2020), Disp: 420 mL, Rfl: 0 Social History   Socioeconomic History   Marital status: Single    Spouse name: Not on file   Number of children: Not on file   Years of education: Not on file   Highest education level: Not on file  Occupational History   Not on file  Tobacco Use   Smoking status: Current Every Day Smoker    Packs/day: 0.20    Years: 15.00    Pack years: 3.00    Types: Cigarettes   Smokeless tobacco: Never Used  Scientific laboratory technician  Use: Never used  Substance and Sexual Activity   Alcohol use: Yes    Comment: Rarely   Drug use: Never   Sexual activity: Yes    Birth control/protection: I.U.D.  Other Topics Concern   Not on file  Social History Narrative   Not on file   Social Determinants of Health   Financial Resource Strain: Not on file  Food Insecurity: Not on file  Transportation Needs: Not on file  Physical Activity: Not on file  Stress: Not on file  Social Connections: Not on file  Intimate Partner Violence: Not on file   Family History  Problem Relation Age of Onset   Cancer Mother    COPD Mother    COPD Father    Cancer Maternal  Grandmother    COPD Maternal Grandmother    Colon cancer Neg Hx    Esophageal cancer Neg Hx    Rectal cancer Neg Hx    Stomach cancer Neg Hx     Objective: Office vital signs reviewed. BP 114/72    Pulse 84    Temp 98.7 F (37.1 C) (Temporal)    Ht $R'5\' 4"'qI$  (1.626 m)    Wt 227 lb (103 kg)    SpO2 98%    BMI 38.96 kg/m   Physical Examination:  General: Awake, alert, well nourished, No acute distress HEENT: No exophthalmos.  No goiter. Cardio: regular rate and rhythm, S1S2 heard, no murmurs appreciated Pulm: clear to auscultation bilaterally, no wheezes, rhonchi or rales; normal work of breathing on room air MSK: Ambulating independently Neuro: No tremor Psych: Mood is stable, speech is normal, affect is appropriate.  Her attention is somewhat scattered during today's visit  Assessment/ Plan: 50 y.o. female   Attention deficit hyperactivity disorder (ADHD), predominantly inattentive type - Plan: amphetamine-dextroamphetamine (ADDERALL) 10 MG tablet, amphetamine-dextroamphetamine (ADDERALL) 10 MG tablet, amphetamine-dextroamphetamine (ADDERALL) 10 MG tablet  Hypothyroidism due to Hashimoto's thyroiditis - Plan: CMP14+EGFR, Thyroid Panel With TSH  Right arm pain  Need for immunization against influenza - Plan: Flu Vaccine QUAD 36+ mos IM  ADHD has been somewhat exacerbated but she is been out of her medications.  The Adderall has been renewed.  The national narcotic Activase was reviewed and there were no red flags.  She is up-to-date on UDS and CSC.  Asymptomatic from a thyroid standpoint.  Check thyroid levels, CMP.  She is currently under the care of of a specialist for her arm pain.  Overall symptoms seem to be improving with gabapentin.  This has been updated in her MAR  Influenza vaccination was administered during today's visit  No orders of the defined types were placed in this encounter.  No orders of the defined types were placed in this encounter.    Janora Norlander, DO [place] [phone]

## 2020-08-24 LAB — CMP14+EGFR
ALT: 24 IU/L (ref 0–32)
AST: 27 IU/L (ref 0–40)
Albumin/Globulin Ratio: 1.3 (ref 1.2–2.2)
Albumin: 4 g/dL (ref 3.8–4.8)
Alkaline Phosphatase: 51 IU/L (ref 44–121)
BUN/Creatinine Ratio: 10 (ref 9–23)
BUN: 9 mg/dL (ref 6–24)
Bilirubin Total: 0.3 mg/dL (ref 0.0–1.2)
CO2: 23 mmol/L (ref 20–29)
Calcium: 8.9 mg/dL (ref 8.7–10.2)
Chloride: 103 mmol/L (ref 96–106)
Creatinine, Ser: 0.9 mg/dL (ref 0.57–1.00)
GFR calc Af Amer: 87 mL/min/{1.73_m2} (ref >59)
GFR calc non Af Amer: 75 mL/min/{1.73_m2} (ref >59)
Globulin, Total: 3 g/dL (ref 1.5–4.5)
Glucose: 113 mg/dL — ABNORMAL HIGH (ref 65–99)
Potassium: 4.1 mmol/L (ref 3.5–5.2)
Sodium: 141 mmol/L (ref 134–144)
Total Protein: 7 g/dL (ref 6.0–8.5)

## 2020-08-24 LAB — THYROID PANEL WITH TSH
Free Thyroxine Index: 3.1 (ref 1.2–4.9)
T3 Uptake Ratio: 29 % (ref 24–39)
T4, Total: 10.6 ug/dL (ref 4.5–12.0)
TSH: 1.54 u[IU]/mL (ref 0.450–4.500)

## 2020-08-30 ENCOUNTER — Encounter: Payer: Self-pay | Admitting: Family Medicine

## 2020-09-17 ENCOUNTER — Encounter: Payer: Self-pay | Admitting: *Deleted

## 2020-11-19 NOTE — Progress Notes (Signed)
.   Subjective: CC: Hypothyroidism, ADHD PCP: Janora Norlander, DO ZOX:WRUE Jicha is a 50 y.o. female presenting to clinic today for:  1.  Hypothyroidism Patient is compliant with her Synthroid.  She notes that the manufacturer was recently changed and she is not sure that it is working as well as it had been.  She admits that her symptoms may be situational in nature as she has had increased anxiety and depressive symptoms secondary to her friend, who's health has declined since the passing of her mother.  Ms Be[place]ne cares for her friends terminally ill husband and unfortunately has to see her deteriorate on a daily basis.  She worries quite a bit about her.  2.  ADHD Patient has been stable on current dose of Adderall 20 mg each morning and 10 mg each afternoon.  Denies any excessive dryness, constipation, insomnia.  She is at increased anxiety as above.  She has been taking Wellbutrin and this also does not seem to be as effective as it had been but again is not sure if this is situational or secondary to change in manufacturer.   ROS: Per HPI  No Known Allergies Past Medical History:  Diagnosis Date  . Allergy   . Anxiety   . Asthma   . Blood transfusion without reported diagnosis   . Depression   . GERD (gastroesophageal reflux disease)   . Thyroid disease     Current Outpatient Medications:  .  amphetamine-dextroamphetamine (ADDERALL) 10 MG tablet, Take 20mg  every morning and 10mg  every afternoon, Disp: 90 tablet, Rfl: 0 .  b complex vitamins capsule, Take 1 capsule by mouth daily., Disp: , Rfl:  .  buPROPion (WELLBUTRIN XL) 150 MG 24 hr tablet, Take 1 tablet (150 mg total) by mouth daily., Disp: 90 tablet, Rfl: 3 .  famotidine (PEPCID) 20 MG tablet, Take 20 mg by mouth daily., Disp: , Rfl:  .  fexofenadine (ALLEGRA) 180 MG tablet, Take 180 mg by mouth daily., Disp: , Rfl:  .  FISH OIL-KRILL OIL PO, Take by mouth., Disp: , Rfl:  .  gabapentin (NEURONTIN) 300 MG capsule,  Take 300 mg by mouth 3 (three) times daily., Disp: , Rfl:  .  levothyroxine (SYNTHROID) 175 MCG tablet, TAKE 1 TABLET EVERY DAY BEFORE BREAKFAST, Disp: 90 tablet, Rfl: 3 .  Multiple Vitamin (MULTIVITAMIN) tablet, Take 1 tablet by mouth daily., Disp: , Rfl:  .  OVER THE COUNTER MEDICATION, Vitamin D 3 2000 IU one capsule daily., Disp: , Rfl:  .  amphetamine-dextroamphetamine (ADDERALL) 10 MG tablet, Take 20mg  every morning and 10mg  every afternoon (Patient not taking: Reported on 11/20/2020), Disp: 90 tablet, Rfl: 0 .  amphetamine-dextroamphetamine (ADDERALL) 10 MG tablet, Take 20mg  every morning and 10mg  every afternoon (Patient not taking: Reported on 11/20/2020), Disp: 90 tablet, Rfl: 0 .  triamcinolone (NASACORT) 55 MCG/ACT AERO nasal inhaler, Place 2 sprays into the nose daily. (Patient not taking: Reported on 11/20/2020), Disp: 1 Inhaler, Rfl: 12 Social History   Socioeconomic History  . Marital status: Single    Spouse name: Not on file  . Number of children: Not on file  . Years of education: Not on file  . Highest education level: Not on file  Occupational History  . Not on file  Tobacco Use  . Smoking status: Current Every Day Smoker    Packs/day: 0.20    Years: 15.00    Pack years: 3.00    Types: Cigarettes  . Smokeless tobacco: Never Used  Vaping Use  . Vaping Use: Never used  Substance and Sexual Activity  . Alcohol use: Yes    Comment: Rarely  . Drug use: Never  . Sexual activity: Yes    Birth control/protection: I.U.D.  Other Topics Concern  . Not on file  Social History Narrative  . Not on file   Social Determinants of Health   Financial Resource Strain: Not on file  Food Insecurity: Not on file  Transportation Needs: Not on file  Physical Activity: Not on file  Stress: Not on file  Social Connections: Not on file  Intimate Partner Violence: Not on file   Family History  Problem Relation Age of Onset  . Cancer Mother   . COPD Mother   . COPD Father   .  Cancer Maternal Grandmother   . COPD Maternal Grandmother   . Colon cancer Neg Hx   . Esophageal cancer Neg Hx   . Rectal cancer Neg Hx   . Stomach cancer Neg Hx     Objective: Office vital signs reviewed. BP 125/75   Pulse 86   Temp (!) 96.7 F (35.9 C)   Ht 5\' 4"  (1.626 m)   Wt 231 lb 12.8 oz (105.1 kg)   SpO2 97%   BMI 39.79 kg/m   Physical Examination:  General: Awake, alert, well nourished, No acute distress HEENT: Normal, sclera white, no goiter or exophthalmos Cardio: regular rate and rhythm, S1S2 heard, no murmurs appreciated Pulm: clear to auscultation bilaterally, no wheezes, rhonchi or rales; normal work of breathing on room air Extremities: warm, well perfused, No edema, cyanosis or clubbing; +2 pulses bilaterally MSK: Ambulating independently Neuro: No tremor Psych: Patient is very pleasant, interactive.  She is visibly stressed when talking about her friend  Depression screen [facility] 2/9 11/20/2020 08/23/2020 04/09/2020  Decreased Interest 2 1 1   Down, Depressed, Hopeless 2 1 1   PHQ - 2 Score 4 2 2   Altered sleeping 2 0 0  Tired, decreased energy 2 0 0  Change in appetite 2 0 1  Feeling bad or failure about yourself  0 0 0  Trouble concentrating 0 3 1  Moving slowly or fidgety/restless 2 0 0  Suicidal thoughts 0 0 0  PHQ-9 Score 12 5 4   Difficult doing work/chores Somewhat difficult - Not difficult at all   GAD 7 : Generalized Anxiety Score 11/20/2020 08/23/2020 04/09/2020 08/29/2018  Nervous, Anxious, on Edge 2 2 3 1   Control/stop worrying 2 1 3 1   Worry too much - different things 2 1 3 1   Trouble relaxing 2 0 1 0  Restless 0 1 0 0  Easily annoyed or irritable 2 1 2  0  Afraid - awful might happen 2 2 1  0  Total GAD 7 Score 12 8 13 3   Anxiety Difficulty Somewhat difficult Somewhat difficult Somewhat difficult Somewhat difficult      Assessment/ Plan: 50 y.o. female   Attention deficit hyperactivity disorder (ADHD), predominantly inattentive type - Plan:  amphetamine-dextroamphetamine (ADDERALL) 10 MG tablet, amphetamine-dextroamphetamine (ADDERALL) 10 MG tablet, amphetamine-dextroamphetamine (ADDERALL) 10 MG tablet  Hypothyroidism due to Hashimoto's thyroiditis - Plan: TSH, T4, Free  Obesity due to excess calories without serious comorbidity, unspecified classification - Plan: Lipid Panel  Elevated serum glucose - Plan: Bayer DCA Hb A1c Waived  Screening, anemia, deficiency, iron - Plan: CBC  Encounter for hepatitis C screening test for low risk patient - Plan: Hepatitis C antibody  Situational stress  ADHD is stable.  National narcotic database  was reviewed and there were no red flags.  CSA and UDS are up-to-date.  Plan to renew at appropriate interval  Somewhat symptomatic from a thyroid standpoint but may be manifestations of situational stress.  Check TSH and free T4.  She will come in for these labs as she is currently taking biotin.  We will do a 2-week cleanout for coming in for laboratory checkup  Check fasting lipid panel.  She is aware that she should fast  Last elevated serum glucose was apparently nonfasting but will check A1c with other labs  Check CBC for screening for anemia  No record of hepatitis C screening previously.  This has been ordered  Offered increased dose of Wellbutrin versus sending Rx to a different pharmacy so that she may obtain the previous manufacture.  Her goal is to wean off of Wellbutrin ultimately so she is somewhat hesitant to advance the dose.  We will await her decision and proceed accordingly  Orders Placed This Encounter  Procedures  . TSH    Standing Status:   Future    Standing Expiration Date:   11/20/2021  . T4, Free    Standing Status:   Future    Standing Expiration Date:   11/20/2021  . Lipid Panel    Standing Status:   Future    Standing Expiration Date:   11/20/2021  . Bayer DCA Hb A1c Waived    Standing Status:   Future    Standing Expiration Date:   11/20/2021  . CBC     Standing Status:   Future    Standing Expiration Date:   11/20/2021  . Hepatitis C antibody    Standing Status:   Future    Standing Expiration Date:   11/20/2021   No orders of the defined types were placed in this encounter.    Janora Norlander, DO [place] [phone]

## 2020-11-20 ENCOUNTER — Ambulatory Visit (INDEPENDENT_AMBULATORY_CARE_PROVIDER_SITE_OTHER): Payer: 59 | Admitting: Family Medicine

## 2020-11-20 ENCOUNTER — Encounter: Payer: Self-pay | Admitting: Family Medicine

## 2020-11-20 ENCOUNTER — Other Ambulatory Visit: Payer: Self-pay

## 2020-11-20 VITALS — BP 125/75 | HR 86 | Temp 96.7°F | Ht 64.0 in | Wt 231.8 lb

## 2020-11-20 DIAGNOSIS — E038 Other specified hypothyroidism: Secondary | ICD-10-CM

## 2020-11-20 DIAGNOSIS — F900 Attention-deficit hyperactivity disorder, predominantly inattentive type: Secondary | ICD-10-CM | POA: Diagnosis not present

## 2020-11-20 DIAGNOSIS — E6609 Other obesity due to excess calories: Secondary | ICD-10-CM

## 2020-11-20 DIAGNOSIS — R739 Hyperglycemia, unspecified: Secondary | ICD-10-CM

## 2020-11-20 DIAGNOSIS — F439 Reaction to severe stress, unspecified: Secondary | ICD-10-CM

## 2020-11-20 DIAGNOSIS — Z0000 Encounter for general adult medical examination without abnormal findings: Secondary | ICD-10-CM

## 2020-11-20 DIAGNOSIS — Z130 Encounter for screening for diseases of the blood and blood-forming organs and certain disorders involving the immune mechanism: Secondary | ICD-10-CM

## 2020-11-20 DIAGNOSIS — E063 Autoimmune thyroiditis: Secondary | ICD-10-CM

## 2020-11-20 DIAGNOSIS — F17200 Nicotine dependence, unspecified, uncomplicated: Secondary | ICD-10-CM

## 2020-11-20 DIAGNOSIS — Z1159 Encounter for screening for other viral diseases: Secondary | ICD-10-CM

## 2020-11-20 MED ORDER — AMPHETAMINE-DEXTROAMPHETAMINE 10 MG PO TABS: ORAL_TABLET | ORAL | 0 refills | Status: DC

## 2020-11-22 ENCOUNTER — Ambulatory Visit: Payer: 59 | Admitting: Family Medicine

## 2020-12-09 ENCOUNTER — Encounter: Payer: Self-pay | Admitting: Family Medicine

## 2020-12-09 ENCOUNTER — Other Ambulatory Visit: Payer: Self-pay | Admitting: Family Medicine

## 2020-12-09 ENCOUNTER — Telehealth: Payer: Self-pay

## 2020-12-09 DIAGNOSIS — K219 Gastro-esophageal reflux disease without esophagitis: Secondary | ICD-10-CM

## 2020-12-09 MED ORDER — SUCRALFATE 1 GM/10ML PO SUSP: 1.0000 g | Freq: Three times a day (TID) | ORAL | 0 refills | Status: DC | PRN

## 2020-12-09 MED ORDER — BUPROPION HCL ER (XL) 300 MG PO TB24: 300.0000 mg | ORAL_TABLET | Freq: Every day | ORAL | 3 refills | Status: DC

## 2020-12-09 NOTE — Telephone Encounter (Signed)
FYI: Julie Alvarado called from [place] stating that the Sucralfate Suspension is not covered by insurance but said the tablets were. Needed approval from Dr Lajuana Ripple to change Rx to pill form.  Spoke with Dr Lajuana Ripple and she approved.   Called Julie Alvarado at the pharmacy and gave him verbal approval per Dr Lajuana Ripple.

## 2020-12-10 ENCOUNTER — Telehealth: Payer: Self-pay | Admitting: *Deleted

## 2020-12-10 NOTE — Telephone Encounter (Signed)
Sucralfate 1 gm/47ml PA came in  Key: [place], [place]  12/09/20 2:07 PM Note FYI: Catalina Antigua called from [place] stating that the Sucralfate Suspension is not covered by insurance but said the tablets were. Needed approval from Dr Lajuana Ripple to change Rx to pill form.  Spoke with Dr Lajuana Ripple and she approved.   Called Matt at the pharmacy and gave him verbal approval per Dr Lajuana Ripple

## 2020-12-18 ENCOUNTER — Encounter: Payer: Self-pay | Admitting: Emergency Medicine

## 2020-12-18 ENCOUNTER — Other Ambulatory Visit: Payer: Self-pay

## 2020-12-18 ENCOUNTER — Ambulatory Visit
Admission: EM | Admit: 2020-12-18 | Discharge: 2020-12-18 | Disposition: A | Discharge status: Home / Self Care | Payer: 59 | Attending: Family Medicine | Admitting: Family Medicine

## 2020-12-18 ENCOUNTER — Ambulatory Visit (INDEPENDENT_AMBULATORY_CARE_PROVIDER_SITE_OTHER): Payer: 59

## 2020-12-18 DIAGNOSIS — R52 Pain, unspecified: Secondary | ICD-10-CM

## 2020-12-18 DIAGNOSIS — M25551 Pain in right hip: Secondary | ICD-10-CM

## 2020-12-18 MED ORDER — DEXAMETHASONE SODIUM PHOSPHATE 10 MG/ML IJ SOLN
10.0000 mg | Freq: Once | INTRAMUSCULAR | Status: AC
  Administered 2020-12-18: 10 mg via INTRAMUSCULAR

## 2020-12-18 MED ORDER — KETOROLAC TROMETHAMINE 30 MG/ML IJ SOLN
30.0000 mg | Freq: Once | INTRAMUSCULAR | Status: AC
  Administered 2020-12-18: 30 mg via INTRAMUSCULAR

## 2020-12-18 NOTE — ED Provider Notes (Signed)
[facility]    CSN: [id number] Arrival date & time: 12/18/20  1438      History   Chief Complaint Chief Complaint  Patient presents with  . Hip Pain    HPI Julie Alvarado is a 50 y.o. female.   Reports right hip pain for the last 2 weeks. Pain began after working out and doing squats. Has not attempted OTC treatment. Denies previous symptoms. Reports that she has been working with PT for arthritis in her back and neck. She is concerned about arthritis/bursitis in the hip. Denies recent injury, radiating pain, numbness, tingling, loss of bowel or bladder.   ROS per HPI  The history is provided by the patient.  Hip Pain    Past Medical History:  Diagnosis Date  . Allergy   . Anxiety   . Asthma   . Blood transfusion without reported diagnosis   . Depression   . GERD (gastroesophageal reflux disease)   . Thyroid disease     Patient Active Problem List   Diagnosis Date Noted  . Non-seasonal allergic rhinitis 08/08/2019  . Gastroesophageal reflux disease 12/05/2018  . Hypothyroidism due to Hashimoto's thyroiditis 08/29/2018  . Attention deficit hyperactivity disorder (ADHD) 08/29/2018  . Tobacco use disorder 08/29/2018    Past Surgical History:  Procedure Laterality Date  . CESAREAN SECTION    . EXTERNAL EAR SURGERY    . TONSILECTOMY, ADENOIDECTOMY, BILATERAL MYRINGOTOMY AND TUBES      OB History   No obstetric history on file.      Home Medications    Prior to Admission medications   Medication Sig Start Date End Date Taking? Authorizing Provider  amphetamine-dextroamphetamine (ADDERALL) 10 MG tablet Take 20mg  every morning and 10mg  every afternoon 02/02/21   Ronnie Doss M, DO  amphetamine-dextroamphetamine (ADDERALL) 10 MG tablet Take 20mg  every morning and 10mg  every afternoon 01/03/21   Ronnie Doss M, DO  amphetamine-dextroamphetamine (ADDERALL) 10 MG tablet Take 20mg  every morning and 10mg  every afternoon 12/04/20   Ronnie Doss  M, DO  b complex vitamins capsule Take 1 capsule by mouth daily.    [provider]  buPROPion (WELLBUTRIN XL) 300 MG 24 hr tablet Take 1 tablet (300 mg total) by mouth daily. 12/09/20   Janora Norlander, DO  famotidine (PEPCID) 20 MG tablet Take 20 mg by mouth daily.    [provider]  fexofenadine (ALLEGRA) 180 MG tablet Take 180 mg by mouth daily.    [provider]  FISH OIL-KRILL OIL PO Take by mouth.    [provider]  gabapentin (NEURONTIN) 300 MG capsule Take 300 mg by mouth 3 (three) times daily. 08/12/20   [provider]  levothyroxine (SYNTHROID) 175 MCG tablet TAKE 1 TABLET EVERY DAY BEFORE BREAKFAST 06/06/20   Ronnie Doss M, DO  Multiple Vitamin (MULTIVITAMIN) tablet Take 1 tablet by mouth daily.    [provider]  OVER THE COUNTER MEDICATION Vitamin D 3 2000 IU one capsule daily.    [provider]  sucralfate (CARAFATE) 1 GM/10ML suspension Take 10 mLs (1 g total) by mouth 3 (three) times daily as needed. 12/09/20   Janora Norlander, DO  triamcinolone (NASACORT) 55 MCG/ACT AERO nasal inhaler Place 2 sprays into the nose daily. Patient not taking: Reported on 11/20/2020 08/08/19   Janora Norlander, DO    Family History Family History  Problem Relation Age of Onset  . Cancer Mother   . COPD Mother   . COPD Father   .  Cancer Maternal Grandmother   . COPD Maternal Grandmother   . Colon cancer Neg Hx   . Esophageal cancer Neg Hx   . Rectal cancer Neg Hx   . Stomach cancer Neg Hx     Social History Social History   Tobacco Use  . Smoking status: Current Every Day Smoker    Packs/day: 0.20    Years: 15.00    Pack years: 3.00    Types: Cigarettes  . Smokeless tobacco: Never Used  Vaping Use  . Vaping Use: Never used  Substance Use Topics  . Alcohol use: Yes    Comment: Rarely  . Drug use: Never     Allergies   Patient has no known allergies.   Review of Systems Review of  Systems   Physical Exam Triage Vital Signs ED Triage Vitals  Enc Vitals Group     BP 12/18/20 1458 (!) 109/58     Pulse Rate 12/18/20 1458 89     Resp 12/18/20 1458 18     Temp 12/18/20 1458 98.3 F (36.8 C)     Temp Source 12/18/20 1458 Oral     SpO2 12/18/20 1458 97 %     Weight --      Height --      Head Circumference --      Peak Flow --      Pain Score 12/18/20 1457 3     Pain Loc --      Pain Edu? --      Excl. in [place]? --    No data found.  Updated Vital Signs BP (!) 109/58 (BP Location: Right Arm)   Pulse 89   Temp 98.3 F (36.8 C) (Oral)   Resp 18   SpO2 97%       Physical Exam Vitals and nursing note reviewed.  Constitutional:      General: She is not in acute distress.    Appearance: She is well-developed.  HENT:     Head: Normocephalic and atraumatic.  Eyes:     Conjunctiva/sclera: Conjunctivae normal.  Cardiovascular:     Rate and Rhythm: Normal rate and regular rhythm.     Heart sounds: No murmur heard.   Pulmonary:     Effort: Pulmonary effort is normal. No respiratory distress.     Breath sounds: Normal breath sounds.  Abdominal:     Palpations: Abdomen is soft.     Tenderness: There is no abdominal tenderness.  Musculoskeletal:     Cervical back: Neck supple.       Legs:  Skin:    General: Skin is warm and dry.  Neurological:     Mental Status: She is alert.      UC Treatments / Results  Labs (all labs ordered are listed, but only abnormal results are displayed) Labs Reviewed - No data to display  EKG   Radiology DG Hip Unilat With Pelvis 2-3 Views Right  Result Date: 12/18/2020 CLINICAL DATA:  Pain EXAM: DG HIP (WITH OR WITHOUT PELVIS) 2-3V RIGHT COMPARISON:  None. FINDINGS: Frontal pelvis as well as frontal and lateral right hip images were obtained. No fracture or dislocation. Joint spaces appear unremarkable. No erosive change. There is an intrauterine device in the mid-pelvis. IMPRESSION: No fracture or dislocation. No  appreciable arthropathy. Intrauterine device in mid pelvis. Electronically Signed   By: Lowella Grip III M.D.   On: 12/18/2020 15:43    Procedures Procedures (including critical care time)  Medications Ordered in UC Medications  dexamethasone (DECADRON) injection 10 mg (has no administration in time range)  ketorolac (TORADOL) 30 MG/ML injection 30 mg (has no administration in time range)    Initial Impression / Assessment and Plan / UC Course  I have reviewed the triage vital signs and the nursing notes.  Pertinent labs & imaging results that were available during my care of the patient were reviewed by me and considered in my medical decision making (see chart for details).    Right Hip Pain  Xray today negative for fracture or misalignment Toradol 30mg  IM in office Decadron 10mg  IM in office Follow up with this office or with primary care if symptoms are persisting. Follow up in the ER for high fever, trouble swallowing, trouble breathing, other concerning symptoms.   Final Clinical Impressions(s) / UC Diagnoses   Final diagnoses:  Right hip pain  Pain     Discharge Instructions     You have received a steroid and toradol injections for pain and inflammation  Xray today shows no fracture or misalignment of the hip. Radiology did not mention arthritis either.  Follow up with this office or with primary care if symptoms are persisting.  Follow up in the ER for high fever, trouble swallowing, trouble breathing, other concerning symptoms.     ED Prescriptions    None     PDMP not reviewed this encounter.   Faustino Congress, NP 12/18/20 1549

## 2020-12-18 NOTE — ED Triage Notes (Signed)
RT hip pain for past 2 weeks. Started after pt was doing some squats.

## 2020-12-18 NOTE — Discharge Instructions (Addendum)
You have received a steroid and toradol injections for pain and inflammation  Xray today shows no fracture or misalignment of the hip. Radiology did not mention arthritis either.  Follow up with this office or with primary care if symptoms are persisting.  Follow up in the ER for high fever, trouble swallowing, trouble breathing, other concerning symptoms.

## 2021-02-25 ENCOUNTER — Ambulatory Visit: Payer: 59 | Admitting: Family Medicine

## 2021-03-06 ENCOUNTER — Encounter: Payer: Self-pay | Admitting: Family Medicine

## 2021-03-12 ENCOUNTER — Other Ambulatory Visit: Payer: Self-pay | Admitting: Family Medicine

## 2021-03-12 DIAGNOSIS — F900 Attention-deficit hyperactivity disorder, predominantly inattentive type: Secondary | ICD-10-CM

## 2021-04-08 ENCOUNTER — Encounter: Payer: 59 | Admitting: Family Medicine

## 2021-04-14 ENCOUNTER — Other Ambulatory Visit: Payer: Self-pay | Admitting: Family Medicine

## 2021-04-14 DIAGNOSIS — E063 Autoimmune thyroiditis: Secondary | ICD-10-CM

## 2021-04-14 DIAGNOSIS — E038 Other specified hypothyroidism: Secondary | ICD-10-CM

## 2021-04-14 DIAGNOSIS — F900 Attention-deficit hyperactivity disorder, predominantly inattentive type: Secondary | ICD-10-CM

## 2021-04-17 ENCOUNTER — Encounter: Payer: Self-pay | Admitting: Family Medicine

## 2021-04-17 NOTE — Telephone Encounter (Signed)
Ok to put her on a video visit on my next on call day

## 2021-05-05 ENCOUNTER — Ambulatory Visit: Payer: 59 | Admitting: Family Medicine

## 2021-05-06 ENCOUNTER — Encounter: Payer: Self-pay | Admitting: Family Medicine

## 2021-05-06 ENCOUNTER — Ambulatory Visit (INDEPENDENT_AMBULATORY_CARE_PROVIDER_SITE_OTHER): Payer: 59 | Admitting: Family Medicine

## 2021-05-06 ENCOUNTER — Other Ambulatory Visit: Payer: Self-pay

## 2021-05-06 VITALS — BP 115/69 | HR 80 | Temp 97.6°F | Ht 64.0 in | Wt 234.0 lb

## 2021-05-06 DIAGNOSIS — Z79899 Other long term (current) drug therapy: Secondary | ICD-10-CM

## 2021-05-06 DIAGNOSIS — Z8616 Personal history of COVID-19: Secondary | ICD-10-CM | POA: Diagnosis not present

## 2021-05-06 DIAGNOSIS — E063 Autoimmune thyroiditis: Secondary | ICD-10-CM

## 2021-05-06 DIAGNOSIS — E038 Other specified hypothyroidism: Secondary | ICD-10-CM | POA: Diagnosis not present

## 2021-05-06 DIAGNOSIS — G9331 Postviral fatigue syndrome: Secondary | ICD-10-CM

## 2021-05-06 DIAGNOSIS — L02213 Cutaneous abscess of chest wall: Secondary | ICD-10-CM

## 2021-05-06 DIAGNOSIS — F900 Attention-deficit hyperactivity disorder, predominantly inattentive type: Secondary | ICD-10-CM

## 2021-05-06 DIAGNOSIS — J310 Chronic rhinitis: Secondary | ICD-10-CM

## 2021-05-06 DIAGNOSIS — J329 Chronic sinusitis, unspecified: Secondary | ICD-10-CM

## 2021-05-06 MED ORDER — AMPHETAMINE-DEXTROAMPHETAMINE 10 MG PO TABS: ORAL_TABLET | ORAL | 0 refills | Status: DC

## 2021-05-06 MED ORDER — DOXYCYCLINE HYCLATE 100 MG PO TABS: 100.0000 mg | ORAL_TABLET | Freq: Two times a day (BID) | ORAL | 0 refills | Status: AC

## 2021-05-06 MED ORDER — MONTELUKAST SODIUM 10 MG PO TABS: 10.0000 mg | ORAL_TABLET | Freq: Every day | ORAL | 3 refills | Status: DC

## 2021-05-06 NOTE — Patient Instructions (Signed)
Hidradenitis Suppurativa Hidradenitis suppurativa is a long-term (chronic) skin disease. It is similar to a severe form of acne, but it affects areas of the body where acne would be unusual, especially areas of the body where skin rubs against skin and becomes moist. These include: Underarms. Groin. Genital area. Buttocks. Upper thighs. Breasts. Hidradenitis suppurativa may start out as small lumps or pimples caused by blocked sweat glands or hair follicles. Pimples may develop into deep sores that break open (rupture) and drain pus. Over time, affected areas of skin may thicken and become scarred. This condition is rare and does not spread from person to person (non-contagious). What are the causes? The exact cause of this condition is not known. It may be related to: Female and female hormones. An overactive disease-fighting system (immune system). The immune system may over-react to blocked hair follicles or sweat glands and cause swelling and pus-filled sores. What increases the risk? You are more likely to develop this condition if you: Are female. Are 11-55 years old. Have a family history of hidradenitis suppurativa. Have a personal history of acne. Are overweight. Smoke. Take the medicine lithium. What are the signs or symptoms? The first symptoms are usually painful bumps in the skin, similar to pimples. The condition may get worse over time (progress), or it may only cause mild symptoms. If the disease progresses, symptoms may include: Skin bumps getting bigger and growing deeper into the skin. Bumps rupturing and draining pus. Itchy, infected skin. Skin getting thicker and scarred. Tunnels under the skin (fistulas) where pus drains from a bump. Pain during daily activities, such as pain during walking if your groin area is affected. Emotional problems, such as stress or depression. This condition may affect your appearance and your ability or willingness to wear certain clothes  or do certain activities. How is this diagnosed? This condition is diagnosed by a health care provider who specializes in skin diseases (dermatologist). You may be diagnosed based on: Your symptoms and medical history. A physical exam. Testing a pus sample for infection. Blood tests. How is this treated? Your treatment will depend on how severe your symptoms are. The same treatment will not work for everybody with this condition. You may need to try several treatments to find what works best for you. Treatment may include: Cleaning and bandaging (dressing) your wounds as needed. Lifestyle changes, such as new skin care routines. Taking medicines, such as: Antibiotics. Acne medicines. Medicines to reduce the activity of the immune system. A diabetes medicine (metformin). Birth control pills, for women. Steroids to reduce swelling and pain. Working with a mental health care provider, if you experience emotional distress due to this condition. If you have severe symptoms that do not get better with medicine, you may need surgery. Surgery may involve: Using a laser to clear the skin and remove hair follicles. Opening and draining deep sores. Removing the areas of skin that are diseased and scarred. Follow these instructions at home: Medicines  Take over-the-counter and prescription medicines only as told by your health care provider. If you were prescribed an antibiotic medicine, take it as told by your health care provider. Do not stop taking the antibiotic even if your condition improves. Skin care If you have open wounds, cover them with a clean dressing as told by your health care provider. Keep wounds clean by washing them gently with soap and water when you bathe. Do not shave the areas where you get hidradenitis suppurativa. Do not wear deodorant. Wear loose-fitting   clothes. Try to avoid getting overheated or sweaty. If you get sweaty or wet, change into clean, dry clothes as soon  as you can. To help relieve pain and itchiness, cover sore areas with a warm, clean washcloth (warm compress) for 5-10 minutes as often as needed. If told by your health care provider, take a bleach bath twice a week: Fill your bathtub halfway with water. Pour in  cup of unscented household bleach. Soak in the tub for 5-10 minutes. Only soak from the neck down. Avoid water on your face and hair. Shower to rinse off the bleach from your skin. General instructions Learn as much as you can about your disease so that you have an active role in your treatment. Work closely with your health care provider to find treatments that work for you. If you are overweight, work with your health care provider to lose weight as recommended. Do not use any products that contain nicotine or tobacco, such as cigarettes and e-cigarettes. If you need help quitting, ask your health care provider. If you struggle with living with this condition, talk with your health care provider or work with a mental health care provider as recommended. Keep all follow-up visits as told by your health care provider. This is important. Where to find more information Hidradenitis [place].: https://www.hs-foundation.org/ American Academy of Dermatology: http://www.nguyen-hutchinson.com/ Contact a health care provider if you have: A flare-up of hidradenitis suppurativa. A fever or chills. Trouble controlling your symptoms at home. Trouble doing your daily activities because of your symptoms. Trouble dealing with emotional problems related to your condition. Summary Hidradenitis suppurativa is a long-term (chronic) skin disease. It is similar to a severe form of acne, but it affects areas of the body where acne would be unusual. The first symptoms are usually painful bumps in the skin, similar to pimples. The condition may only cause mild symptoms, or it may get worse over time (progress). If you have open wounds, cover them  with a clean dressing as told by your health care provider. Keep wounds clean by washing them gently with soap and water when you bathe. Besides skin care, treatment may include medicines, laser treatment, and surgery. This information is not intended to replace advice given to you by your health care provider. Make sure you discuss any questions you have with your health care provider. Document Revised: 05/14/2020 Document Reviewed: 05/14/2020 Elsevier Patient Education  2022 [employer].

## 2021-05-06 NOTE — Progress Notes (Signed)
Subjective: [id number] refill PCP: Janora Norlander, DO GXQ:JJHE Julie Alvarado is a 50 y.o. female presenting to clinic today for:  1. ADHD Has been scatterbrained.  Has been out of her medication.  2.  COVID-19 Patient status post COVID-19.  She reports ongoing feeling like she cannot take a deep breath in and has no energy.  She was recently restarted on gabapentin 3 times daily and admits that energy is even worse with that.  She reports ongoing excessive phlegm, particularly at the back of her throat and behind her sinuses.  Utilizing her Allegra and Nasacort but is not utilizing any Mucinex.  She developed some strange bumps underneath her bra and one under her right armpit.  No treatments.  No reports of oozing or fevers.   ROS: Per HPI  No Known Allergies Past Medical History:  Diagnosis Date   Allergy    Anxiety    Asthma    Blood transfusion without reported diagnosis    Depression    GERD (gastroesophageal reflux disease)    Thyroid disease     Current Outpatient Medications:    amphetamine-dextroamphetamine (ADDERALL) 10 MG tablet, Take 63m every morning and 180mevery afternoon, Disp: 90 tablet, Rfl: 0   amphetamine-dextroamphetamine (ADDERALL) 10 MG tablet, Take 2025mvery morning and 74m42mery afternoon, Disp: 90 tablet, Rfl: 0   amphetamine-dextroamphetamine (ADDERALL) 10 MG tablet, Take 20mg64mry morning and 74mg 70my afternoon, Disp: 90 tablet, Rfl: 0   b complex vitamins capsule, Take 1 capsule by mouth daily., Disp: , Rfl:    buPROPion (WELLBUTRIN XL) 300 MG 24 hr tablet, Take 1 tablet (300 mg total) by mouth daily., Disp: 90 tablet, Rfl: 3   famotidine (PEPCID) 20 MG tablet, Take 20 mg by mouth daily., Disp: , Rfl:    fexofenadine (ALLEGRA) 180 MG tablet, Take 180 mg by mouth daily., Disp: , Rfl:    FISH OIL-KRILL OIL PO, Take by mouth., Disp: , Rfl:    gabapentin (NEURONTIN) 300 MG capsule, Take 300 mg by mouth 3 (three) times daily., Disp: , Rfl:     levothyroxine (SYNTHROID) 175 MCG tablet, TAKE ONE TABLET BY MOUTH DAILY BEFORE BREAKFAST, Disp: 90 tablet, Rfl: 0   Multiple Vitamin (MULTIVITAMIN) tablet, Take 1 tablet by mouth daily., Disp: , Rfl:    OVER THE COUNTER MEDICATION, Vitamin D 3 2000 IU one capsule daily., Disp: , Rfl:    sucralfate (CARAFATE) 1 GM/10ML suspension, Take 10 mLs (1 g total) by mouth 3 (three) times daily as needed., Disp: 420 mL, Rfl: 0   triamcinolone (NASACORT) 55 MCG/ACT AERO nasal inhaler, Place 2 sprays into the nose daily. (Patient not taking: Reported on 11/20/2020), Disp: 1 Inhaler, Rfl: 12 Social History   Socioeconomic History   Marital status: Single    Spouse name: Not on file   Number of children: Not on file   Years of education: Not on file   Highest education level: Not on file  Occupational History   Not on file  Tobacco Use   Smoking status: Every Day    Packs/day: 0.20    Years: 15.00    Pack years: 3.00    Types: Cigarettes   Smokeless tobacco: Never  Vaping Use   Vaping Use: Never used  Substance and Sexual Activity   Alcohol use: Yes    Comment: Rarely   Drug use: Never   Sexual activity: Yes    Birth control/protection: I.U.D.  Other Topics Concern   Not on file  Social History Narrative   Not on file   Social Determinants of Health   Financial Resource Strain: Not on file  Food Insecurity: Not on file  Transportation Needs: Not on file  Physical Activity: Not on file  Stress: Not on file  Social Connections: Not on file  Intimate Partner Violence: Not on file   Family History  Problem Relation Age of Onset   Cancer Mother    COPD Mother    COPD Father    Cancer Maternal Grandmother    COPD Maternal Grandmother    Colon cancer Neg Hx    Esophageal cancer Neg Hx    Rectal cancer Neg Hx    Stomach cancer Neg Hx     Objective: Office vital signs reviewed. BP 115/69   Pulse 80   Temp 97.6 F (36.4 C)   Ht _0  (1.626 m)   Wt 234 lb (106.1 kg)   SpO2  98%   BMI 40.17 kg/m   Physical Examination:  General: Awake, alert, morbidly obese, No acute distress HEENT: Normal; sclera white.  No exophthalmos.  No palpable thyroid masses Cardio: regular rate and rhythm, S1S2 heard, no murmurs appreciated Pulm: clear to auscultation bilaterally, no wheezes, rhonchi or rales; normal work of breathing on room air Skin: Kidney bean sized area of induration noted along the left inferior breast.  There is no central punctum but there is some increased discoloration and warmth.  She has a very similar lesion noted along the right axilla that is much smaller but similar in texture and color Psych: Some difficulty focusing  Assessment/ Plan: 50 y.o. female   Attention deficit hyperactivity disorder (ADHD), predominantly inattentive type - Plan: ToxASSURE Select 13 (MW), Urine, amphetamine-dextroamphetamine (ADDERALL) 10 MG tablet, amphetamine-dextroamphetamine (ADDERALL) 10 MG tablet, amphetamine-dextroamphetamine (ADDERALL) 10 MG tablet  Controlled substance agreement signed - Plan: ToxASSURE Select 13 (MW), Urine  Hypothyroidism due to Hashimoto's thyroiditis - Plan: CMP14+EGFR, TSH, T4, Free  Personal history of COVID-19  Postviral fatigue syndrome - Plan: CBC with Differential, CMP14+EGFR  Rhinosinusitis - Plan: montelukast (SINGULAIR) 10 MG tablet  Cutaneous abscess of chest wall - Plan: doxycycline (VIBRA-TABS) 100 MG tablet  ADHD Not controlled but needs medication renewal.  She is seen when she was having some difficulty concentrating during her visit today.  [place] narcotic database reviewed and there were no red flags.  Medications renewed.  Controlled substance contract and drug screen were updated as per office policy  Will check thyroid labs given recent COVID-19 infection and fatigue  However, I suspect that fatigue is likely related to postviral fatigue syndrome.  If her breathing does not gradually improve over time we will plan to refer  to pulmonology for formal pulmonary function testing.  In the interim I have added Singulair in hopes that this might help some with breathing and help with the sinus symptoms that she is experiencing  The lesions on her trunk were consistent with abscess. ?  Hidradenitis suppurativa?  We will treat with doxycycline.  There is no palpable fluctuance to I&D was not performed  No orders of the defined types were placed in this encounter.  No orders of the defined types were placed in this encounter.    Janora Norlander, DO [place] [phone]

## 2021-05-07 ENCOUNTER — Other Ambulatory Visit: Payer: Self-pay

## 2021-05-07 DIAGNOSIS — E063 Autoimmune thyroiditis: Secondary | ICD-10-CM

## 2021-05-07 DIAGNOSIS — E038 Other specified hypothyroidism: Secondary | ICD-10-CM

## 2021-05-07 DIAGNOSIS — R739 Hyperglycemia, unspecified: Secondary | ICD-10-CM

## 2021-05-07 LAB — CMP14+EGFR
ALT: 27 IU/L (ref 0–32)
AST: 29 IU/L (ref 0–40)
Albumin/Globulin Ratio: 1.4 (ref 1.2–2.2)
Albumin: 4.2 g/dL (ref 3.8–4.8)
Alkaline Phosphatase: 50 IU/L (ref 44–121)
BUN/Creatinine Ratio: 9 (ref 9–23)
BUN: 8 mg/dL (ref 6–24)
Bilirubin Total: 0.3 mg/dL (ref 0.0–1.2)
CO2: 21 mmol/L (ref 20–29)
Calcium: 9 mg/dL (ref 8.7–10.2)
Chloride: 103 mmol/L (ref 96–106)
Creatinine, Ser: 0.91 mg/dL (ref 0.57–1.00)
Globulin, Total: 3 g/dL (ref 1.5–4.5)
Glucose: 130 mg/dL — ABNORMAL HIGH (ref 70–99)
Potassium: 4.1 mmol/L (ref 3.5–5.2)
Sodium: 139 mmol/L (ref 134–144)
Total Protein: 7.2 g/dL (ref 6.0–8.5)
eGFR: 77 mL/min/{1.73_m2} (ref >59)

## 2021-05-07 LAB — CBC WITH DIFFERENTIAL/PLATELET
Basophils Absolute: 0.1 10*3/uL (ref 0.0–0.2)
Basos: 1 %
EOS (ABSOLUTE): 0.2 10*3/uL (ref 0.0–0.4)
Eos: 3 %
Hematocrit: 43.3 % (ref 34.0–46.6)
Hemoglobin: 14.8 g/dL (ref 11.1–15.9)
Immature Grans (Abs): 0 10*3/uL (ref 0.0–0.1)
Immature Granulocytes: 0 %
Lymphocytes Absolute: 2 10*3/uL (ref 0.7–3.1)
Lymphs: 29 %
MCH: 32.6 pg (ref 26.6–33.0)
MCHC: 34.2 g/dL (ref 31.5–35.7)
MCV: 95 fL (ref 79–97)
Monocytes Absolute: 0.5 10*3/uL (ref 0.1–0.9)
Monocytes: 8 %
Neutrophils Absolute: 4.2 10*3/uL (ref 1.4–7.0)
Neutrophils: 59 %
Platelets: 286 10*3/uL (ref 150–450)
RBC: 4.54 x10E6/uL (ref 3.77–5.28)
RDW: 12.7 % (ref 11.7–15.4)
WBC: 6.9 10*3/uL (ref 3.4–10.8)

## 2021-05-07 LAB — T4, FREE: Free T4: 1.15 ng/dL (ref 0.82–1.77)

## 2021-05-07 LAB — TSH: TSH: 5.5 u[IU]/mL — ABNORMAL HIGH (ref 0.450–4.500)

## 2021-05-08 LAB — HGB A1C W/O EAG: Hgb A1c MFr Bld: 5.8 % — ABNORMAL HIGH (ref 4.8–5.6)

## 2021-05-08 LAB — SPECIMEN STATUS REPORT

## 2021-05-11 LAB — TOXASSURE SELECT 13 (MW), URINE

## 2021-05-14 ENCOUNTER — Encounter: Payer: Self-pay | Admitting: Family Medicine

## 2021-05-14 ENCOUNTER — Other Ambulatory Visit: Payer: Self-pay

## 2021-05-14 ENCOUNTER — Ambulatory Visit (INDEPENDENT_AMBULATORY_CARE_PROVIDER_SITE_OTHER): Payer: 59 | Admitting: Family Medicine

## 2021-05-14 VITALS — BP 130/76 | HR 83 | Temp 98.1°F | Ht 64.0 in | Wt 236.4 lb

## 2021-05-14 DIAGNOSIS — K219 Gastro-esophageal reflux disease without esophagitis: Secondary | ICD-10-CM

## 2021-05-14 DIAGNOSIS — R140 Abdominal distension (gaseous): Secondary | ICD-10-CM

## 2021-05-14 NOTE — Progress Notes (Signed)
Assessment & Plan:  1. Bloating - printed education provided about diet and lifestyle modifications  2. Gastroesophageal reflux disease without esophagitis - encouraged to continue pepcid and carafate - encouraged use of a food diary to help pinpoint foods that are exacerbating her symptoms and causing vomiting   Follow up plan: Return if symptoms worsen or fail to improve.  Lucile Crater, NP Student  I personally was present during the history, physical exam, and medical decision-making activities of this service and have verified that the service and findings are accurately documented in the nurse practitioner student's note.  Hendricks Limes, MSN, APRN, FNP-C [facility]   Subjective:   Patient ID: Julie Alvarado, female    DOB: 11/05/1970, 50 y.o.   MRN: [id number]  HPI: Julie Alvarado is a 50 y.o. female presenting on 05/14/2021 for Bloated (Patient states she has been having on abd bloating and pain that has been ongoing.  Vomiting after eating x 1 week )   Bloating and discomfort and pain with eating for years, she has a diagnosis of GERD. She takes pepcid and carafate for this. She also had COVID recently and post-covid sinusitis that she was placed on doxycycline. She states that mucinex helps with nasal congestion and mucus but she has not been taking it consistently because she isn't always able to drink enough water to help it work. She states she "gets a big ball of mucus in the back of her throat" that may trigger her gag reflex. In the last week, she has noticed an increase in her bloating and stomach discomfort. She has not been taking her doxycycline consistently due to fear that it is contributing to her vomiting. She states she thinks it is not related at this point. She states she has vomited 2x this week. She thinks that this could be related to her diet, which is mostly carbs per pt report. She states she wants to try a "sugar detox" to help reduce  inflammation in her body after having COVID.    ROS: Negative unless specifically indicated above in HPI.   Relevant past medical history reviewed and updated as indicated.   Allergies and medications reviewed and updated.   Current Outpatient Medications:    amphetamine-dextroamphetamine (ADDERALL) 10 MG tablet, Take 20mg  every morning and 10mg  every afternoon, Disp: 90 tablet, Rfl: 0   [START ON 07/05/2021] amphetamine-dextroamphetamine (ADDERALL) 10 MG tablet, Take 20mg  every morning and 10mg  every afternoon, Disp: 90 tablet, Rfl: 0   [START ON 06/05/2021] amphetamine-dextroamphetamine (ADDERALL) 10 MG tablet, Take 20mg  every morning and 10mg  every afternoon, Disp: 90 tablet, Rfl: 0   b complex vitamins capsule, Take 1 capsule by mouth daily., Disp: , Rfl:    buPROPion (WELLBUTRIN XL) 300 MG 24 hr tablet, Take 1 tablet (300 mg total) by mouth daily., Disp: 90 tablet, Rfl: 3   doxycycline (VIBRA-TABS) 100 MG tablet, Take 1 tablet (100 mg total) by mouth 2 (two) times daily for 10 days., Disp: 20 tablet, Rfl: 0   famotidine (PEPCID) 20 MG tablet, Take 20 mg by mouth daily., Disp: , Rfl:    fexofenadine (ALLEGRA) 180 MG tablet, Take 180 mg by mouth daily., Disp: , Rfl:    FISH OIL-KRILL OIL PO, Take by mouth., Disp: , Rfl:    gabapentin (NEURONTIN) 300 MG capsule, Take 300 mg by mouth 3 (three) times daily., Disp: , Rfl:    levothyroxine (SYNTHROID) 175 MCG tablet, TAKE ONE TABLET BY MOUTH DAILY BEFORE BREAKFAST, Disp:  90 tablet, Rfl: 0   Multiple Vitamin (MULTIVITAMIN) tablet, Take 1 tablet by mouth daily., Disp: , Rfl:    OVER THE COUNTER MEDICATION, Vitamin D 3 2000 IU one capsule daily., Disp: , Rfl:    sucralfate (CARAFATE) 1 GM/10ML suspension, Take 10 mLs (1 g total) by mouth 3 (three) times daily as needed., Disp: 420 mL, Rfl: 0  No Known Allergies  Objective:   BP 130/76   Pulse 83   Temp 98.1 F (36.7 C) (Temporal)   Ht 5\' 4"  (1.626 m)   Wt 107.2 kg   SpO2 99%   BMI 40.58  kg/m    Physical Exam Vitals reviewed.  Constitutional:      General: She is not in acute distress.    Appearance: Normal appearance. She is normal weight. She is not ill-appearing, toxic-appearing or diaphoretic.  HENT:     Head: Normocephalic and atraumatic.     Nose: Nose normal.     Mouth/Throat:     Mouth: Mucous membranes are moist.     Pharynx: Oropharynx is clear.  Eyes:     Extraocular Movements: Extraocular movements intact.     Conjunctiva/sclera: Conjunctivae normal.     Pupils: Pupils are equal, round, and reactive to light.  Pulmonary:     Effort: Pulmonary effort is normal. No respiratory distress.  Abdominal:     General: There is distension.     Palpations: Abdomen is soft. There is no mass.     Tenderness: There is generalized abdominal tenderness.  Musculoskeletal:        General: Normal range of motion.     Cervical back: Normal range of motion.  Skin:    General: Skin is warm and dry.  Neurological:     General: No focal deficit present.     Mental Status: She is alert and oriented to person, place, and time.     Motor: No weakness.     Gait: Gait normal.  Psychiatric:        Mood and Affect: Mood normal.        Behavior: Behavior normal.        Thought Content: Thought content normal.        Judgment: Judgment normal.

## 2021-05-16 ENCOUNTER — Telehealth: Payer: Self-pay | Admitting: Family Medicine

## 2021-05-17 ENCOUNTER — Ambulatory Visit
Admission: EM | Admit: 2021-05-17 | Discharge: 2021-05-17 | Disposition: A | Discharge status: Home / Self Care | Payer: 59 | Attending: Urgent Care | Admitting: Urgent Care

## 2021-05-17 ENCOUNTER — Ambulatory Visit (INDEPENDENT_AMBULATORY_CARE_PROVIDER_SITE_OTHER): Payer: 59

## 2021-05-17 ENCOUNTER — Other Ambulatory Visit: Payer: Self-pay

## 2021-05-17 DIAGNOSIS — M7062 Trochanteric bursitis, left hip: Secondary | ICD-10-CM | POA: Diagnosis not present

## 2021-05-17 DIAGNOSIS — M25552 Pain in left hip: Secondary | ICD-10-CM

## 2021-05-17 MED ORDER — MELOXICAM 7.5 MG PO TABS: 7.5000 mg | ORAL_TABLET | Freq: Every day | ORAL | 0 refills | Status: DC

## 2021-05-17 NOTE — ED Provider Notes (Signed)
[place]   MRN: [id number] DOB: [birth year]  Subjective:   Julie Alvarado is a 50 y.o. female presenting for 1 day history of moderate to severe left lateral hip pain.  No fall, trauma, bruising, swelling.  Patient is having difficulty ambulating from the pain, flexing.  Had a similar episode of right hip pain in earlier this year.  X-rays were negative.  No current facility-administered medications for this encounter.  Current Outpatient Medications:    amphetamine-dextroamphetamine (ADDERALL) 10 MG tablet, Take 20mg  every morning and 10mg  every afternoon, Disp: 90 tablet, Rfl: 0   [START ON 07/05/2021] amphetamine-dextroamphetamine (ADDERALL) 10 MG tablet, Take 20mg  every morning and 10mg  every afternoon, Disp: 90 tablet, Rfl: 0   [START ON 06/05/2021] amphetamine-dextroamphetamine (ADDERALL) 10 MG tablet, Take 20mg  every morning and 10mg  every afternoon, Disp: 90 tablet, Rfl: 0   b complex vitamins capsule, Take 1 capsule by mouth daily., Disp: , Rfl:    buPROPion (WELLBUTRIN XL) 300 MG 24 hr tablet, Take 1 tablet (300 mg total) by mouth daily., Disp: 90 tablet, Rfl: 3   famotidine (PEPCID) 20 MG tablet, Take 20 mg by mouth daily., Disp: , Rfl:    fexofenadine (ALLEGRA) 180 MG tablet, Take 180 mg by mouth daily., Disp: , Rfl:    FISH OIL-KRILL OIL PO, Take by mouth., Disp: , Rfl:    gabapentin (NEURONTIN) 300 MG capsule, Take 300 mg by mouth 3 (three) times daily., Disp: , Rfl:    levothyroxine (SYNTHROID) 175 MCG tablet, TAKE ONE TABLET BY MOUTH DAILY BEFORE BREAKFAST, Disp: 90 tablet, Rfl: 0   Multiple Vitamin (MULTIVITAMIN) tablet, Take 1 tablet by mouth daily., Disp: , Rfl:    OVER THE COUNTER MEDICATION, Vitamin D 3 2000 IU one capsule daily., Disp: , Rfl:    sucralfate (CARAFATE) 1 GM/10ML suspension, Take 10 mLs (1 g total) by mouth 3 (three) times daily as needed., Disp: 420 mL, Rfl: 0   No Known Allergies  Past Medical History:  Diagnosis Date   Allergy     Anxiety    Asthma    Blood transfusion without reported diagnosis    Depression    GERD (gastroesophageal reflux disease)    Thyroid disease      Past Surgical History:  Procedure Laterality Date   CESAREAN SECTION     EXTERNAL EAR SURGERY     TONSILECTOMY, ADENOIDECTOMY, BILATERAL MYRINGOTOMY AND TUBES      Family History  Problem Relation Age of Onset   Cancer Mother    COPD Mother    COPD Father    Cancer Maternal Grandmother    COPD Maternal Grandmother    Colon cancer Neg Hx    Esophageal cancer Neg Hx    Rectal cancer Neg Hx    Stomach cancer Neg Hx     Social History   Tobacco Use   Smoking status: Every Day    Packs/day: 0.20    Years: 15.00    Pack years: 3.00    Types: Cigarettes   Smokeless tobacco: Never  Vaping Use   Vaping Use: Never used  Substance Use Topics   Alcohol use: Yes    Comment: Rarely   Drug use: Never    ROS   Objective:   Vitals: BP 122/77   Pulse 90   Temp 98.5 F (36.9 C)   Resp 20   SpO2 95%   Physical Exam Constitutional:      General: She is not in acute distress.  Appearance: Normal appearance. She is well-developed. She is obese. She is not ill-appearing, toxic-appearing or diaphoretic.  HENT:     Head: Normocephalic and atraumatic.     Nose: Nose normal.     Mouth/Throat:     Mouth: Mucous membranes are moist.     Pharynx: Oropharynx is clear.  Eyes:     General: No scleral icterus.    Extraocular Movements: Extraocular movements intact.     Pupils: Pupils are equal, round, and reactive to light.  Cardiovascular:     Rate and Rhythm: Normal rate.  Pulmonary:     Effort: Pulmonary effort is normal.  Musculoskeletal:     Left hip: Tenderness (over area outlined) and bony tenderness present. No deformity, lacerations or crepitus. Decreased range of motion. Normal strength.       Legs:  Skin:    General: Skin is warm and dry.  Neurological:     General: No focal deficit present.     Mental Status:  She is alert and oriented to person, place, and time.     Motor: No weakness.     Coordination: Coordination abnormal (favoring left hip).     Gait: Gait normal.     Deep Tendon Reflexes: Reflexes normal.  Psychiatric:        Mood and Affect: Mood normal.        Behavior: Behavior normal.        Thought Content: Thought content normal.        Judgment: Judgment normal.    No obvious abnormality on x-ray related to her hip pain.  Assessment and Plan :   PDMP not reviewed this encounter.  1. Left hip pain    X-ray over-read was pending at time of discharge, recommended follow up with only abnormal results. Otherwise will not call for negative over-read. Patient was in agreement.  Will manage for trochanteric bursitis with meloxicam, rest, icing. Counseled patient on potential for adverse effects with medications prescribed/recommended today, ER and return-to-clinic precautions discussed, patient verbalized understanding.    Jaynee Eagles, PA-C 05/17/21 1545

## 2021-05-17 NOTE — ED Triage Notes (Signed)
Pt presents with c/o left hip pain that began yesterday, denies injury

## 2021-05-18 ENCOUNTER — Telehealth (HOSPITAL_COMMUNITY): Payer: Self-pay | Admitting: Urgent Care

## 2021-05-18 MED ORDER — MELOXICAM 7.5 MG PO TABS: 7.5000 mg | ORAL_TABLET | Freq: Every day | ORAL | 0 refills | Status: DC

## 2021-05-18 NOTE — Telephone Encounter (Signed)
Patient was not able to pick up the prescription yesterday as she got to the pharmacy and anterograde closed.  She request that I send a new prescription to the [facility] in [place].

## 2021-06-19 ENCOUNTER — Encounter: Payer: Self-pay | Admitting: Internal Medicine

## 2021-06-23 ENCOUNTER — Ambulatory Visit: Payer: 59

## 2021-08-07 ENCOUNTER — Encounter: Payer: 59 | Admitting: Family Medicine

## 2021-08-12 ENCOUNTER — Telehealth: Payer: Self-pay | Admitting: Family Medicine

## 2021-08-12 DIAGNOSIS — E063 Autoimmune thyroiditis: Secondary | ICD-10-CM

## 2021-08-12 DIAGNOSIS — E038 Other specified hypothyroidism: Secondary | ICD-10-CM

## 2021-08-12 MED ORDER — LEVOTHYROXINE SODIUM 175 MCG PO TABS: ORAL_TABLET | ORAL | 0 refills | Status: DC

## 2021-08-12 NOTE — Telephone Encounter (Signed)
°  Prescription Request  08/12/2021  Is this a "Controlled Substance" medicine? No   Have you seen your PCP in the last 2 weeks? No pt has appt on 08/27/21 with DR. Darnell Level.   If YES, route message to pool  -  If NO, patient needs to be scheduled for appointment.  What is the name of the medication or equipment? levothyroxine (SYNTHROID) 175 MCG tablet   Have you contacted your pharmacy to request a refill? yes   Which pharmacy would you like this sent to? [facility] in [place], she is transferring pharmacies   Patient notified that their request is being sent to the clinical staff for review and that they should receive a response within 2 business days.

## 2021-08-27 ENCOUNTER — Encounter: Payer: Medicaid Other | Admitting: Family Medicine

## 2021-09-30 ENCOUNTER — Ambulatory Visit (INDEPENDENT_AMBULATORY_CARE_PROVIDER_SITE_OTHER): Payer: Self-pay | Admitting: Family Medicine

## 2021-09-30 ENCOUNTER — Encounter: Payer: Self-pay | Admitting: Family Medicine

## 2021-09-30 VITALS — BP 151/102 | HR 85 | Temp 97.8°F | Ht 64.0 in | Wt 238.0 lb

## 2021-09-30 DIAGNOSIS — E038 Other specified hypothyroidism: Secondary | ICD-10-CM

## 2021-09-30 DIAGNOSIS — F900 Attention-deficit hyperactivity disorder, predominantly inattentive type: Secondary | ICD-10-CM

## 2021-09-30 DIAGNOSIS — G8929 Other chronic pain: Secondary | ICD-10-CM

## 2021-09-30 DIAGNOSIS — E063 Autoimmune thyroiditis: Secondary | ICD-10-CM

## 2021-09-30 DIAGNOSIS — M25512 Pain in left shoulder: Secondary | ICD-10-CM

## 2021-09-30 DIAGNOSIS — Z6379 Other stressful life events affecting family and household: Secondary | ICD-10-CM

## 2021-09-30 MED ORDER — AMPHETAMINE-DEXTROAMPHETAMINE 10 MG PO TABS: ORAL_TABLET | ORAL | 0 refills | Status: DC

## 2021-09-30 NOTE — Progress Notes (Signed)
Subjective: CC: ADHD PCP: Janora Norlander, DO ZOX:WRUE Kovack is a 51 y.o. female presenting to clinic today for:  1.  ADHD Patient has been out of her medications for a few days now.  She reports increased stress surrounding the diagnosis of her father with pancreatic cancer.  He was recently readmitted to the hospital due to dehydration and complications of inability to take in p.o. adequately.  She has had quite a bit of bills piling up at home with relation to his imaging studies that were performed outpatient.  She is trying to get him into the [place] for ongoing care.  She had to quit school and will not be returning to work for at least another 1 month due to everything that is going on with him.  She reports feeling overwhelmed.  2.  Left shoulder pain This is been a chronic issue for this patient.  She was being seen by [facility] for this but because she is now between insurances she cannot get the care that she needs.  She reports ongoing discomfort and limitation of active range of motion   ROS: Per HPI  No Known Allergies Past Medical History:  Diagnosis Date   Allergy    Anxiety    Asthma    Blood transfusion without reported diagnosis    Depression    GERD (gastroesophageal reflux disease)    Thyroid disease     Current Outpatient Medications:    amphetamine-dextroamphetamine (ADDERALL) 10 MG tablet, Take 20mg  every morning and 10mg  every afternoon, Disp: 90 tablet, Rfl: 0   buPROPion (WELLBUTRIN XL) 300 MG 24 hr tablet, Take 1 tablet (300 mg total) by mouth daily., Disp: 90 tablet, Rfl: 3   famotidine (PEPCID) 20 MG tablet, Take 20 mg by mouth daily., Disp: , Rfl:    fexofenadine (ALLEGRA) 180 MG tablet, Take 180 mg by mouth daily., Disp: , Rfl:    FISH OIL-KRILL OIL PO, Take by mouth., Disp: , Rfl:    gabapentin (NEURONTIN) 300 MG capsule, Take 300 mg by mouth 3 (three) times daily., Disp: , Rfl:    levothyroxine (SYNTHROID) 175 MCG tablet, TAKE ONE TABLET BY MOUTH  DAILY BEFORE BREAKFAST, Disp: 90 tablet, Rfl: 0   meloxicam (MOBIC) 7.5 MG tablet, Take 1 tablet (7.5 mg total) by mouth daily., Disp: 30 tablet, Rfl: 0   methocarbamol (ROBAXIN) 750 MG tablet, Take 750 mg by mouth 3 (three) times daily as needed., Disp: , Rfl:    Multiple Vitamin (MULTIVITAMIN) tablet, Take 1 tablet by mouth daily., Disp: , Rfl:    OVER THE COUNTER MEDICATION, Vitamin D 3 2000 IU one capsule daily., Disp: , Rfl:    sucralfate (CARAFATE) 1 GM/10ML suspension, Take 10 mLs (1 g total) by mouth 3 (three) times daily as needed., Disp: 420 mL, Rfl: 0   amphetamine-dextroamphetamine (ADDERALL) 10 MG tablet, Take 20mg  every morning and 10mg  every afternoon (Patient not taking: Reported on 09/30/2021), Disp: 90 tablet, Rfl: 0   amphetamine-dextroamphetamine (ADDERALL) 10 MG tablet, Take 20mg  every morning and 10mg  every afternoon (Patient not taking: Reported on 09/30/2021), Disp: 90 tablet, Rfl: 0   b complex vitamins capsule, Take 1 capsule by mouth daily. (Patient not taking: Reported on 09/30/2021), Disp: , Rfl:  Social History   Socioeconomic History   Marital status: Single    Spouse name: Not on file   Number of children: Not on file   Years of education: Not on file   Highest education level: Not on  file  Occupational History   Not on file  Tobacco Use   Smoking status: Every Day    Packs/day: 0.20    Years: 15.00    Pack years: 3.00    Types: Cigarettes   Smokeless tobacco: Never  Vaping Use   Vaping Use: Never used  Substance and Sexual Activity   Alcohol use: Yes    Comment: Rarely   Drug use: Never   Sexual activity: Yes    Birth control/protection: I.U.D.  Other Topics Concern   Not on file  Social History Narrative   Not on file   Social Determinants of Health   Financial Resource Strain: Not on file  Food Insecurity: Not on file  Transportation Needs: Not on file  Physical Activity: Not on file  Stress: Not on file  Social Connections: Not on file   Intimate Partner Violence: Not on file   Family History  Problem Relation Age of Onset   Cancer Mother    COPD Mother    COPD Father    Cancer Maternal Grandmother    COPD Maternal Grandmother    Colon cancer Neg Hx    Esophageal cancer Neg Hx    Rectal cancer Neg Hx    Stomach cancer Neg Hx     Objective: Office vital signs reviewed. BP (!) 151/102    Pulse 85    Temp 97.8 F (36.6 C)    Ht 5\' 4"  (1.626 m)    Wt 238 lb (108 kg)    SpO2 97%    BMI 40.85 kg/m   Physical Examination:  General: Awake, alert, obese, No acute distress HEENT: No exophthalmos or goiter Cardio: regular rate and rhythm, S1S2 heard, no murmurs appreciated Pulm: clear to auscultation bilaterally, no wheezes, rhonchi or rales; normal work of breathing on room air Psych: tearful, stressed appearing. Depression screen [facility] 2/9 09/30/2021 05/06/2021 05/06/2021  Decreased Interest 3 0 1  Down, Depressed, Hopeless 2 0 0  PHQ - 2 Score 5 0 1  Altered sleeping 3 0 3  Tired, decreased energy 3 0 3  Change in appetite 3 0 3  Feeling bad or failure about yourself  3 0 0  Trouble concentrating 0 0 3  Moving slowly or fidgety/restless 0 0 0  Suicidal thoughts 0 0 0  PHQ-9 Score 17 0 13  Difficult doing work/chores Somewhat difficult Not difficult at all Not difficult at all  Some recent data might be hidden   GAD 7 : Generalized Anxiety Score 09/30/2021 05/06/2021 05/06/2021 11/20/2020  Nervous, Anxious, on Edge 3 0 1 2  Control/stop worrying 3 0 0 2  Worry too much - different things 0 0 0 2  Trouble relaxing 2 0 1 2  Restless 0 0 0 0  Easily annoyed or irritable 3 0 1 2  Afraid - awful might happen 3 0 1 2  Total GAD 7 Score 14 0 4 12  Anxiety Difficulty Somewhat difficult Not difficult at all Not difficult at all Somewhat difficult   Assessment/ Plan: 51 y.o. female   Attention deficit hyperactivity disorder (ADHD), predominantly inattentive type - Plan: amphetamine-dextroamphetamine (ADDERALL) 10 MG  tablet, amphetamine-dextroamphetamine (ADDERALL) 10 MG tablet, amphetamine-dextroamphetamine (ADDERALL) 10 MG tablet  Hypothyroidism due to Hashimoto's thyroiditis  Stress due to illness of family member  Chronic left shoulder pain  Plan for thyroid labs at next visit as she is currently between insurances  ADHD medications have been renewed.  I have given her information on a  different mail order pharmacy that may be a little cheaper for her.  I have also given her the patient assistance form for [facility].  Hopefully we can get her into orthopedics for that shoulder soon.  I reached out to virtual behavioral health to offer her some counseling services as well.  She has a lot of financial stressors and stress due to the illness of her father, who was diagnosed with pancreatic cancer recently  The Narcotic Database has been reviewed.  There were no red flags.     No orders of the defined types were placed in this encounter.  No orders of the defined types were placed in this encounter.    Janora Norlander, DO [place] [phone]

## 2021-10-28 ENCOUNTER — Encounter: Payer: Self-pay | Admitting: Internal Medicine

## 2021-11-01 DIAGNOSIS — Z419 Encounter for procedure for purposes other than remedying health state, unspecified: Secondary | ICD-10-CM | POA: Diagnosis not present

## 2021-11-27 ENCOUNTER — Ambulatory Visit (AMBULATORY_SURGERY_CENTER): Payer: Self-pay | Admitting: *Deleted

## 2021-11-27 VITALS — Ht 64.0 in | Wt 238.0 lb

## 2021-11-27 DIAGNOSIS — Z86010 Personal history of colonic polyps: Secondary | ICD-10-CM

## 2021-11-27 MED ORDER — PLENVU 140 G PO SOLR: 1.0000 | Freq: Once | ORAL | 0 refills | Status: AC

## 2021-11-27 NOTE — Progress Notes (Signed)
Patient's pre-visit was done today over the phone with the patient. Name,DOB and address verified. Patient denies any allergies to Eggs and Soy. Patient denies any problems with anesthesia/sedation. Patient is not taking any diet pills or blood thinners. No home Oxygen. Insurance confirmed with patient-sent pt to [facility]. ? ?Prep instructions sent to pt's MyChart (if available) or mailed to pt-pt is aware. Patient understands to call us back with any questions or concerns. Patient is aware of our care-partner policy.  ? ?EMMI education assigned to the patient for the procedure, sent to [place].  ? ?The patient is COVID-19 vaccinated.   ?

## 2021-12-01 DIAGNOSIS — Z419 Encounter for procedure for purposes other than remedying health state, unspecified: Secondary | ICD-10-CM | POA: Diagnosis not present

## 2021-12-03 ENCOUNTER — Encounter: Payer: Self-pay | Admitting: Internal Medicine

## 2021-12-03 DIAGNOSIS — G8929 Other chronic pain: Secondary | ICD-10-CM | POA: Diagnosis not present

## 2021-12-03 DIAGNOSIS — M7532 Calcific tendinitis of left shoulder: Secondary | ICD-10-CM | POA: Diagnosis not present

## 2021-12-03 DIAGNOSIS — R200 Anesthesia of skin: Secondary | ICD-10-CM | POA: Diagnosis not present

## 2021-12-03 DIAGNOSIS — M25512 Pain in left shoulder: Secondary | ICD-10-CM | POA: Diagnosis not present

## 2021-12-08 ENCOUNTER — Other Ambulatory Visit: Payer: Self-pay | Admitting: Family Medicine

## 2021-12-08 ENCOUNTER — Telehealth: Payer: Self-pay | Admitting: Internal Medicine

## 2021-12-08 NOTE — Telephone Encounter (Signed)
Patient called this morning to cancel procedure for tomorrow.  She does not have anyone who can stay with her father and, as she is his caregiver, she cannot come.  She has rescheduled for 01/21/22. ? ?Thank you. ?

## 2021-12-09 ENCOUNTER — Encounter: Payer: Medicaid Other | Admitting: Internal Medicine

## 2021-12-23 DIAGNOSIS — M7582 Other shoulder lesions, left shoulder: Secondary | ICD-10-CM | POA: Diagnosis not present

## 2021-12-23 DIAGNOSIS — M7532 Calcific tendinitis of left shoulder: Secondary | ICD-10-CM | POA: Diagnosis not present

## 2021-12-23 DIAGNOSIS — M19012 Primary osteoarthritis, left shoulder: Secondary | ICD-10-CM | POA: Diagnosis not present

## 2021-12-25 ENCOUNTER — Ambulatory Visit: Payer: Medicaid Other | Admitting: Family Medicine

## 2021-12-25 ENCOUNTER — Encounter: Payer: Self-pay | Admitting: Family Medicine

## 2021-12-25 VITALS — BP 133/78 | HR 100 | Temp 98.3°F | Ht 64.0 in | Wt 233.2 lb

## 2021-12-25 DIAGNOSIS — E063 Autoimmune thyroiditis: Secondary | ICD-10-CM | POA: Diagnosis not present

## 2021-12-25 DIAGNOSIS — R739 Hyperglycemia, unspecified: Secondary | ICD-10-CM

## 2021-12-25 DIAGNOSIS — Z6379 Other stressful life events affecting family and household: Secondary | ICD-10-CM

## 2021-12-25 DIAGNOSIS — F900 Attention-deficit hyperactivity disorder, predominantly inattentive type: Secondary | ICD-10-CM

## 2021-12-25 DIAGNOSIS — E038 Other specified hypothyroidism: Secondary | ICD-10-CM

## 2021-12-25 DIAGNOSIS — R109 Unspecified abdominal pain: Secondary | ICD-10-CM | POA: Diagnosis not present

## 2021-12-25 LAB — URINALYSIS
Bilirubin, UA: NEGATIVE
Glucose, UA: NEGATIVE
Ketones, UA: NEGATIVE
Leukocytes,UA: NEGATIVE
Nitrite, UA: NEGATIVE
Specific Gravity, UA: 1.015 (ref 1.005–1.030)
Urobilinogen, Ur: 1 mg/dL (ref 0.2–1.0)
pH, UA: 7.5 (ref 5.0–7.5)

## 2021-12-25 LAB — BAYER DCA HB A1C WAIVED: HB A1C (BAYER DCA - WAIVED): 5.7 % — ABNORMAL HIGH (ref 4.8–5.6)

## 2021-12-25 MED ORDER — LEVOTHYROXINE SODIUM 175 MCG PO TABS: ORAL_TABLET | ORAL | 3 refills | Status: DC

## 2021-12-25 MED ORDER — BUPROPION HCL ER (XL) 300 MG PO TB24: 300.0000 mg | ORAL_TABLET | Freq: Every day | ORAL | 3 refills | Status: DC

## 2021-12-25 MED ORDER — AMPHETAMINE-DEXTROAMPHETAMINE 10 MG PO TABS: ORAL_TABLET | ORAL | 0 refills | Status: DC

## 2021-12-25 NOTE — Patient Instructions (Signed)
If you change your mind about grief counseling. Let us know.

## 2021-12-25 NOTE — Progress Notes (Signed)
Subjective: CC: Follow-up hypothyroidism, ADHD PCP: Janora Norlander, DO KDT:OIZT Iglesia is a 51 y.o. female presenting to clinic today for:  1.  Hypothyroidism Patient is compliant with Synthroid.  No reports of change in voice or difficulty swallowing.  She admits that she has been stressed eating a lot has subsequently gained some weight.  Sometimes though she has no appetite at all.  She continues to take care of her father, who has had some cognitive issues that may be suggestive of Parkinson's dementia.  Sometimes she is overwhelmed and worries about leaving him home by himself.  He is noncombative and is not wandering sometimes will leave things like the water or electric razor on  She does not really want to see any counselors at this time and does not want any additional medications other than what she is already taking  2.  ADHD Patient does well when she remembers to take both of her doses but sometimes she forgets this.  Does not report any adverse side effects from the medication.  3.  Right flank pain Patient reports some intermittent right-sided back pain in the flank region over the last 2 to 3 weeks.  Sometimes it will radiate to the front under her ribs but this only occurred yesterday was accompanied by some nausea.  She took her Carafate and this seemed to relieve symptoms.  She takes Pepcid regularly.  Denies any changes in bowel habits.  No dysuria, hematuria, urinary frequency or odors.  No history of renal stones.  Does not seem to be related to any certain foods nor exacerbated by oral intake.   ROS: Per HPI  No Known Allergies Past Medical History:  Diagnosis Date   Allergy    Anxiety    Asthma    Blood transfusion without reported diagnosis    Depression    GERD (gastroesophageal reflux disease)    Thyroid disease     Current Outpatient Medications:    amphetamine-dextroamphetamine (ADDERALL) 10 MG tablet, Take '20mg'$  every morning and '10mg'$  every  afternoon, Disp: 90 tablet, Rfl: 0   b complex vitamins capsule, Take 1 capsule by mouth daily., Disp: , Rfl:    buPROPion (WELLBUTRIN XL) 300 MG 24 hr tablet, TAKE ONE TABLET BY MOUTH ONCE DAILY, Disp: 90 tablet, Rfl: 0   famotidine (PEPCID) 20 MG tablet, Take 20 mg by mouth daily., Disp: , Rfl:    fexofenadine (ALLEGRA) 180 MG tablet, Take 180 mg by mouth daily., Disp: , Rfl:    FISH OIL-KRILL OIL PO, Take by mouth., Disp: , Rfl:    gabapentin (NEURONTIN) 300 MG capsule, Take 300 mg by mouth 3 (three) times daily., Disp: , Rfl:    levothyroxine (SYNTHROID) 175 MCG tablet, TAKE ONE TABLET BY MOUTH DAILY BEFORE BREAKFAST, Disp: 90 tablet, Rfl: 0   methocarbamol (ROBAXIN) 750 MG tablet, Take 750 mg by mouth 3 (three) times daily as needed., Disp: , Rfl:    Multiple Vitamin (MULTIVITAMIN) tablet, Take 1 tablet by mouth daily., Disp: , Rfl:    OVER THE COUNTER MEDICATION, Vitamin D 3 2000 IU one capsule daily., Disp: , Rfl:    sucralfate (CARAFATE) 1 GM/10ML suspension, Take 10 mLs (1 g total) by mouth 3 (three) times daily as needed., Disp: 420 mL, Rfl: 0 Social History   Socioeconomic History   Marital status: Single    Spouse name: Not on file   Number of children: Not on file   Years of education: Not on file  Highest education level: Not on file  Occupational History   Not on file  Tobacco Use   Smoking status: Every Day    Packs/day: 0.20    Years: 15.00    Pack years: 3.00    Types: Cigarettes   Smokeless tobacco: Never  Vaping Use   Vaping Use: Never used  Substance and Sexual Activity   Alcohol use: Not Currently    Comment: Rarely   Drug use: Never   Sexual activity: Yes    Birth control/protection: I.U.D.  Other Topics Concern   Not on file  Social History Narrative   Not on file   Social Determinants of Health   Financial Resource Strain: Not on file  Food Insecurity: Not on file  Transportation Needs: Not on file  Physical Activity: Not on file  Stress: Not  on file  Social Connections: Not on file  Intimate Partner Violence: Not on file   Family History  Problem Relation Age of Onset   Cancer Mother    COPD Mother    Pancreatic cancer Father    COPD Father    Cancer Maternal Grandmother    COPD Maternal Grandmother    Colon cancer Neg Hx    Esophageal cancer Neg Hx    Rectal cancer Neg Hx    Stomach cancer Neg Hx     Objective: Office vital signs reviewed. There were no vitals taken for this visit.  Physical Examination:  General: Awake, alert, nontoxic-appearing female, No acute distress HEENT: No exophthalmos.  No goiter Cardio: regular rate and rhythm  Pulm:  normal work of breathing on room air GI: soft, non-tender, non-distended, bowel sounds present x4, no hepatomegaly, no splenomegaly, no masses GU: No CVA tenderness MSK: Ambulating independently but gait is slightly antalgic and stiff.  No palpable abnormalities and pain is not reproducible on exam Skin: dry; intact; no rashes or lesions; slightly clammy Neuro: No tremor Psych: Overwhelmed appearing    12/25/2021    3:01 PM 09/30/2021    8:42 AM 05/06/2021   11:37 AM  Depression screen PHQ 2/9  Decreased Interest 2 3 0  Down, Depressed, Hopeless 1 2 0  PHQ - 2 Score 3 5 0  Altered sleeping 3 3 0  Tired, decreased energy 3 3 0  Change in appetite 3 3 0  Feeling bad or failure about yourself  0 3 0  Trouble concentrating 1 0 0  Moving slowly or fidgety/restless 0 0 0  Suicidal thoughts 0 0 0  PHQ-9 Score 13 17 0  Difficult doing work/chores Somewhat difficult Somewhat difficult Not difficult at all      12/25/2021    3:02 PM 09/30/2021    8:42 AM 05/06/2021   11:39 AM 05/06/2021   11:13 AM  GAD 7 : Generalized Anxiety Score  Nervous, Anxious, on Edge 2 3 0 1  Control/stop worrying 2 3 0 0  Worry too much - different things 3 0 0 0  Trouble relaxing 3 2 0 1  Restless 0 0 0 0  Easily annoyed or irritable 1 3 0 1  Afraid - awful might happen 3 3 0 1  Total  GAD 7 Score 14 14 0 4  Anxiety Difficulty Somewhat difficult Somewhat difficult Not difficult at all Not difficult at all      Assessment/ Plan: 51 y.o. female   Attention deficit hyperactivity disorder (ADHD), predominantly inattentive type - Plan: ToxASSURE Select 13 (MW), Urine, amphetamine-dextroamphetamine (ADDERALL) 10 MG tablet, amphetamine-dextroamphetamine (ADDERALL)  10 MG tablet, amphetamine-dextroamphetamine (ADDERALL) 10 MG tablet  Hypothyroidism due to Hashimoto's thyroiditis - Plan: TSH, T4, Free, levothyroxine (SYNTHROID) 175 MCG tablet  Elevated serum glucose - Plan: Bayer DCA Hb A1c Waived  Stress due to illness of family member - Plan: buPROPion (WELLBUTRIN XL) 300 MG 24 hr tablet  Right flank pain - Plan: CBC, Urinalysis  ADHD is stable.  Continue current regimen.  Up-to-date on UDS and CSC.  Rx's have been sent postdated  Check TSH, free T4.  Had some biotin containing vitamins recently so it may be abnormal.  Synthroid renewed  Check A1c given history of prediabetes range sugars  Wants to continue Wellbutrin for now.  She continues to stress and be overwhelmed by the care of her father, who is suffering from possible Parkinson's dementia.  She unfortunately is the sole caregiver and does not get much relief.  We discussed consideration for counseling but she is not quite on board with that right now.  I also offered consideration for alternative therapies like SSRI or SNRI but she really does not want to take any additional medications.  She is just trying to work through things on her own  Uncertain etiology of her right flank pain.  I initially thought perhaps this was gallbladder pathology but it was not quite fitting with a gallbladder etiology and she had really no findings that were focal on exam today.  We will check urinalysis to see if perhaps there is anything that might give [place] a clue as to what is going on.  CBC also ordered to look for inflammatory  markers.  I am favoring MSK in etiology and discussed use of heating pads.  No orders of the defined types were placed in this encounter.  No orders of the defined types were placed in this encounter.    Janora Norlander, DO [place] [phone]

## 2021-12-25 NOTE — Addendum Note (Signed)
Addended by: Everlean Cherry on: 12/25/2021 04:06 PM   Modules accepted: Orders

## 2021-12-26 DIAGNOSIS — Z79890 Hormone replacement therapy (postmenopausal): Secondary | ICD-10-CM | POA: Diagnosis not present

## 2021-12-26 DIAGNOSIS — Z79899 Other long term (current) drug therapy: Secondary | ICD-10-CM | POA: Diagnosis not present

## 2021-12-26 DIAGNOSIS — E063 Autoimmune thyroiditis: Secondary | ICD-10-CM | POA: Diagnosis not present

## 2021-12-26 DIAGNOSIS — M7532 Calcific tendinitis of left shoulder: Secondary | ICD-10-CM | POA: Diagnosis not present

## 2021-12-26 DIAGNOSIS — E039 Hypothyroidism, unspecified: Secondary | ICD-10-CM | POA: Diagnosis not present

## 2021-12-26 DIAGNOSIS — R202 Paresthesia of skin: Secondary | ICD-10-CM | POA: Diagnosis not present

## 2021-12-26 DIAGNOSIS — K219 Gastro-esophageal reflux disease without esophagitis: Secondary | ICD-10-CM | POA: Diagnosis not present

## 2021-12-26 DIAGNOSIS — G8929 Other chronic pain: Secondary | ICD-10-CM | POA: Diagnosis not present

## 2021-12-26 DIAGNOSIS — R200 Anesthesia of skin: Secondary | ICD-10-CM | POA: Diagnosis not present

## 2021-12-26 DIAGNOSIS — G5601 Carpal tunnel syndrome, right upper limb: Secondary | ICD-10-CM | POA: Diagnosis not present

## 2021-12-26 DIAGNOSIS — M25512 Pain in left shoulder: Secondary | ICD-10-CM | POA: Diagnosis not present

## 2021-12-26 LAB — CBC
Hematocrit: 43.5 % (ref 34.0–46.6)
Hemoglobin: 15 g/dL (ref 11.1–15.9)
MCH: 33 pg (ref 26.6–33.0)
MCHC: 34.5 g/dL (ref 31.5–35.7)
MCV: 96 fL (ref 79–97)
Platelets: 302 10*3/uL (ref 150–450)
RBC: 4.55 x10E6/uL (ref 3.77–5.28)
RDW: 12.3 % (ref 11.7–15.4)
WBC: 8.8 10*3/uL (ref 3.4–10.8)

## 2021-12-26 LAB — TSH: TSH: 2.82 u[IU]/mL (ref 0.450–4.500)

## 2021-12-26 LAB — T4, FREE: Free T4: 1.57 ng/dL (ref 0.82–1.77)

## 2021-12-30 ENCOUNTER — Ambulatory Visit: Payer: Medicaid Other | Admitting: Family Medicine

## 2021-12-31 LAB — TOXASSURE SELECT 13 (MW), URINE

## 2022-01-01 DIAGNOSIS — Z419 Encounter for procedure for purposes other than remedying health state, unspecified: Secondary | ICD-10-CM | POA: Diagnosis not present

## 2022-01-11 DIAGNOSIS — J45909 Unspecified asthma, uncomplicated: Secondary | ICD-10-CM | POA: Diagnosis not present

## 2022-01-11 DIAGNOSIS — M7581 Other shoulder lesions, right shoulder: Secondary | ICD-10-CM | POA: Insufficient documentation

## 2022-01-11 DIAGNOSIS — F17210 Nicotine dependence, cigarettes, uncomplicated: Secondary | ICD-10-CM | POA: Insufficient documentation

## 2022-01-11 DIAGNOSIS — M79601 Pain in right arm: Secondary | ICD-10-CM | POA: Diagnosis present

## 2022-01-11 DIAGNOSIS — M75101 Unspecified rotator cuff tear or rupture of right shoulder, not specified as traumatic: Secondary | ICD-10-CM | POA: Diagnosis not present

## 2022-01-12 ENCOUNTER — Emergency Department (HOSPITAL_COMMUNITY)
Admission: EM | Admit: 2022-01-12 | Discharge: 2022-01-12 | Disposition: A | Discharge status: Home / Self Care | Payer: Medicaid Other | Attending: Emergency Medicine | Admitting: Emergency Medicine

## 2022-01-12 ENCOUNTER — Other Ambulatory Visit: Payer: Self-pay

## 2022-01-12 ENCOUNTER — Encounter (HOSPITAL_COMMUNITY): Payer: Self-pay

## 2022-01-12 ENCOUNTER — Telehealth (HOSPITAL_COMMUNITY): Payer: Self-pay | Admitting: Emergency Medicine

## 2022-01-12 DIAGNOSIS — M7581 Other shoulder lesions, right shoulder: Secondary | ICD-10-CM

## 2022-01-12 MED ORDER — OXYCODONE HCL 5 MG PO TABS: 5.0000 mg | ORAL_TABLET | ORAL | 0 refills | Status: DC | PRN

## 2022-01-12 MED ORDER — LIDOCAINE 5 % EX PTCH: 1.0000 | MEDICATED_PATCH | CUTANEOUS | 0 refills | Status: DC

## 2022-01-12 MED ORDER — METHOCARBAMOL 500 MG PO TABS: 500.0000 mg | ORAL_TABLET | Freq: Three times a day (TID) | ORAL | 0 refills | Status: DC | PRN

## 2022-01-12 MED ORDER — OXYCODONE-ACETAMINOPHEN 7.5-325 MG PO TABS: 1.0000 | ORAL_TABLET | Freq: Four times a day (QID) | ORAL | 0 refills | Status: DC | PRN

## 2022-01-12 MED ORDER — ACETAMINOPHEN 500 MG PO TABS
1000.0000 mg | ORAL_TABLET | Freq: Once | ORAL | Status: AC
  Administered 2022-01-12: 1000 mg via ORAL
  Filled 2022-01-12: qty 2

## 2022-01-12 NOTE — ED Provider Notes (Signed)
[facility] Emergency Department Provider Note MRN:  [id number]  Arrival date & time: 01/12/22     Chief Complaint   Arm pain History of Present Illness   Julie Alvarado is a 51 y.o. year-old female with no pertinent past medical history presenting to the ED with chief complaint of arm pain.  Pain to the right shoulder with radiation into the right lower arm, present for several days.  No inciting event, no trauma.  Worse with range of motion of the shoulder.  No fever, no other complaints.  Review of Systems  A thorough review of systems was obtained and all systems are negative except as noted in the HPI and PMH.   Patient's Health History    Past Medical History:  Diagnosis Date   Allergy    Anxiety    Asthma    Blood transfusion without reported diagnosis    Depression    GERD (gastroesophageal reflux disease)    Thyroid disease     Past Surgical History:  Procedure Laterality Date   CESAREAN SECTION     COLONOSCOPY  06/21/2018   Dr.Perry   EXTERNAL EAR SURGERY     TONSILECTOMY, ADENOIDECTOMY, BILATERAL MYRINGOTOMY AND TUBES      Family History  Problem Relation Age of Onset   Cancer Mother    COPD Mother    Pancreatic cancer Father    COPD Father    Cancer Maternal Grandmother    COPD Maternal Grandmother    Colon cancer Neg Hx    Esophageal cancer Neg Hx    Rectal cancer Neg Hx    Stomach cancer Neg Hx     Social History   Socioeconomic History   Marital status: Single    Spouse name: Not on file   Number of children: Not on file   Years of education: Not on file   Highest education level: Not on file  Occupational History   Not on file  Tobacco Use   Smoking status: Every Day    Packs/day: 0.20    Years: 15.00    Total pack years: 3.00    Types: Cigarettes   Smokeless tobacco: Never  Vaping Use   Vaping Use: Never used  Substance and Sexual Activity   Alcohol use: Not Currently    Comment: Rarely   Drug use: Never    Sexual activity: Yes    Birth control/protection: I.U.D.  Other Topics Concern   Not on file  Social History Narrative   Not on file   Social Determinants of Health   Financial Resource Strain: Not on file  Food Insecurity: Not on file  Transportation Needs: Not on file  Physical Activity: Not on file  Stress: Not on file  Social Connections: Not on file  Intimate Partner Violence: Not on file     Physical Exam   Vitals:   01/12/22 0021 01/12/22 0100  BP: 138/86 (!) 144/78  Pulse: 86 86  Resp:  17  Temp:  97.9 F (36.6 C)  SpO2: 100% 97%    CONSTITUTIONAL: Well-appearing, NAD NEURO/PSYCH:  Alert and oriented x 3, no focal deficits EYES:  eyes equal and reactive ENT/NECK:  no LAD, no JVD CARDIO: Regular rate, well-perfused, normal S1 and S2 PULM:  CTAB no wheezing or rhonchi GI/GU:  non-distended, non-tender MSK/SPINE:  No gross deformities, no edema, pain elicited with range of motion of the right shoulder.  Neurovascularly intact SKIN:  no rash, atraumatic   *Additional and/or pertinent findings  included in MDM below  Diagnostic and Interventional Summary    EKG Interpretation  Date/Time:    Ventricular Rate:    PR Interval:    QRS Duration:   QT Interval:    QTC Calculation:   R Axis:     Text Interpretation:         Labs Reviewed - No data to display  No orders to display    Medications  acetaminophen (TYLENOL) tablet 1,000 mg (1,000 mg Oral Given 01/12/22 0149)     Procedures  /  Critical Care Procedures  ED Course and Medical Decision Making  Initial Impression and Ddx History and exam consistent with rotator cuff tendinitis.  Normal and symmetric strength and sensation, Spurling's sign negative, no neck pain or tenderness, no trauma, no real indication for imaging, appropriate for discharge with reassurance, pain control, advised orthopedic follow-up.  Past medical/surgical history that increases complexity of ED encounter:  None  Interpretation of Diagnostics Laboratory and/or imaging options to aid in the diagnosis/care of the patient were considered.  After careful history and physical examination, it was determined that there was no indication for diagnostics at this time. Patient Reassessment and Ultimate Disposition/Management     Discharge  Patient management required discussion with the following services or consulting groups:  None  Complexity of Problems Addressed Acute complicated illness or Injury  Additional Data Reviewed and Analyzed Further history obtained from: None  Additional Factors Impacting ED Encounter Risk Prescriptions  Barth Kirks. [place]onia Small, [place] [email or web address]  Final Clinical Impressions(s) / ED Diagnoses     ICD-10-CM   1. Tendinitis of right rotator cuff  M75.81       ED Discharge Orders          Ordered    oxyCODONE (ROXICODONE) 5 MG immediate release tablet  Every 4 hours PRN        01/12/22 0134    methocarbamol (ROBAXIN) 500 MG tablet  Every 8 hours PRN        01/12/22 0134    lidocaine (LIDODERM) 5 %  Every 24 hours        01/12/22 0134             Discharge Instructions Discussed with and Provided to Patient:    Discharge Instructions      You were evaluated in the Emergency Department and after careful evaluation, we did not find any emergent condition requiring admission or further testing in the hospital.  Your exam/testing today was overall reassuring.  Symptoms seem to be due to inflammation or tendinitis of the rotator cuff.  Recommend Tylenol 1000 mg every 4-6 hours.  Can use the numbing patches throughout the day for pain.  Can use the Robaxin muscle relaxer for more significant pain or the oxycodone.  Do not mix these medicines as they cause drowsiness.  Please return to the Emergency Department if you experience any worsening of your condition.  Thank you for allowing [place] to be a  part of your care.       Maudie Flakes, MD 01/12/22 [phone]

## 2022-01-12 NOTE — Telephone Encounter (Signed)
Received call from [facility] stating that previously prescribed oxycodone 5 mg immediate release tablets were not in stock.  They do have the 7.5 mg Percocet.  Prescription was changed to the 7.5 mg Percocet with same quantity.  Patient was also called and advised to not take any additional Tylenol.

## 2022-01-12 NOTE — Discharge Instructions (Signed)
You were evaluated in the Emergency Department and after careful evaluation, we did not find any emergent condition requiring admission or further testing in the hospital.  Your exam/testing today was overall reassuring.  Symptoms seem to be due to inflammation or tendinitis of the rotator cuff.  Recommend Tylenol 1000 mg every 4-6 hours.  Can use the numbing patches throughout the day for pain.  Can use the Robaxin muscle relaxer for more significant pain or the oxycodone.  Do not mix these medicines as they cause drowsiness.  Please return to the Emergency Department if you experience any worsening of your condition.  Thank you for allowing [place] to be a part of your care.

## 2022-01-12 NOTE — ED Triage Notes (Signed)
Complaining of pain in the rt arm that started Thursday. Able to bend arm at the elbow but shoulder and upper arm hurts to move

## 2022-01-16 DIAGNOSIS — M25511 Pain in right shoulder: Secondary | ICD-10-CM | POA: Diagnosis not present

## 2022-01-16 DIAGNOSIS — G8929 Other chronic pain: Secondary | ICD-10-CM | POA: Diagnosis not present

## 2022-01-18 ENCOUNTER — Other Ambulatory Visit: Payer: Self-pay

## 2022-01-18 ENCOUNTER — Encounter (HOSPITAL_COMMUNITY): Payer: Self-pay

## 2022-01-18 DIAGNOSIS — R1030 Lower abdominal pain, unspecified: Secondary | ICD-10-CM | POA: Diagnosis not present

## 2022-01-18 DIAGNOSIS — R109 Unspecified abdominal pain: Secondary | ICD-10-CM | POA: Insufficient documentation

## 2022-01-18 DIAGNOSIS — K760 Fatty (change of) liver, not elsewhere classified: Secondary | ICD-10-CM | POA: Diagnosis not present

## 2022-01-18 DIAGNOSIS — K5730 Diverticulosis of large intestine without perforation or abscess without bleeding: Secondary | ICD-10-CM | POA: Diagnosis not present

## 2022-01-18 DIAGNOSIS — M5459 Other low back pain: Secondary | ICD-10-CM | POA: Diagnosis not present

## 2022-01-18 LAB — URINALYSIS, ROUTINE W REFLEX MICROSCOPIC
Bilirubin Urine: NEGATIVE
Glucose, UA: NEGATIVE mg/dL
Hgb urine dipstick: NEGATIVE
Ketones, ur: NEGATIVE mg/dL
Leukocytes,Ua: NEGATIVE
Nitrite: NEGATIVE
Protein, ur: NEGATIVE mg/dL
Specific Gravity, Urine: 1.026 (ref 1.005–1.030)
pH: 5 (ref 5.0–8.0)

## 2022-01-18 LAB — PREGNANCY, URINE: Preg Test, Ur: NEGATIVE

## 2022-01-18 NOTE — ED Triage Notes (Signed)
Pt presents with 5 week hx of L flank pain. Symptoms started to get better, but pain suddenly worsened today. Pt reports urinary urgency.

## 2022-01-19 ENCOUNTER — Emergency Department (HOSPITAL_COMMUNITY): Payer: Medicaid Other

## 2022-01-19 ENCOUNTER — Emergency Department (HOSPITAL_COMMUNITY)
Admission: EM | Admit: 2022-01-19 | Discharge: 2022-01-19 | Disposition: A | Discharge status: Home / Self Care | Payer: Medicaid Other | Attending: Emergency Medicine | Admitting: Emergency Medicine

## 2022-01-19 DIAGNOSIS — K5730 Diverticulosis of large intestine without perforation or abscess without bleeding: Secondary | ICD-10-CM | POA: Diagnosis not present

## 2022-01-19 DIAGNOSIS — M545 Low back pain, unspecified: Secondary | ICD-10-CM

## 2022-01-19 DIAGNOSIS — K760 Fatty (change of) liver, not elsewhere classified: Secondary | ICD-10-CM | POA: Diagnosis not present

## 2022-01-19 NOTE — ED Provider Notes (Signed)
[facility] EMERGENCY DEPARTMENT Provider Note   CSN: [id number] Arrival date & time: 01/18/22  2140     History  Chief Complaint  Patient presents with   Flank Pain         Julie Alvarado is a 51 y.o. female.  Patient presents to the emergency department for evaluation of right flank pain.  Patient reports that symptoms have been ongoing for weeks.  Patient was told by her doctor today that it might be a kidney stone, referred to the emergency department.  Pain is constant in the right paraspinal low back.  It worsens with certain movements such as sitting down and standing up from a seated position.  Pain does not radiate to extremities.       Home Medications Prior to Admission medications   Medication Sig Start Date End Date Taking? Authorizing Provider  amphetamine-dextroamphetamine (ADDERALL) 10 MG tablet Take '20mg'$  every morning and '10mg'$  every afternoon 12/25/21   Ronnie Doss M, DO  amphetamine-dextroamphetamine (ADDERALL) 10 MG tablet Take '20mg'$  each morning and '10mg'$  each afternoon 01/24/22   Janora Norlander, DO  amphetamine-dextroamphetamine (ADDERALL) 10 MG tablet Take '20mg'$  each morning and '10mg'$  each afternoon 02/23/22   Janora Norlander, DO  b complex vitamins capsule Take 1 capsule by mouth daily.    [provider]  buPROPion (WELLBUTRIN XL) 300 MG 24 hr tablet Take 1 tablet (300 mg total) by mouth daily. 12/25/21   Janora Norlander, DO  famotidine (PEPCID) 20 MG tablet Take 20 mg by mouth daily.    [provider]  fexofenadine (ALLEGRA) 180 MG tablet Take 180 mg by mouth daily.    [provider]  FISH OIL-KRILL OIL PO Take by mouth.    [provider]  gabapentin (NEURONTIN) 300 MG capsule Take 300 mg by mouth 3 (three) times daily. 08/12/20   [provider]  levothyroxine (SYNTHROID) 175 MCG tablet TAKE ONE TABLET BY MOUTH DAILY BEFORE BREAKFAST 12/25/21   Ronnie Doss M, DO  lidocaine (LIDODERM) 5 % Place  1 patch onto the skin daily. Remove & Discard patch within 12 hours or as directed by MD 01/12/22   Maudie Flakes, MD  methocarbamol (ROBAXIN) 500 MG tablet Take 1 tablet (500 mg total) by mouth every 8 (eight) hours as needed for muscle spasms. 01/12/22   Maudie Flakes, MD  Multiple Vitamin (MULTIVITAMIN) tablet Take 1 tablet by mouth daily.    [provider]  OVER THE COUNTER MEDICATION Vitamin D 3 2000 IU one capsule daily.    [provider]  oxyCODONE (ROXICODONE) 5 MG immediate release tablet Take 1 tablet (5 mg total) by mouth every 4 (four) hours as needed for severe pain. 01/12/22   Maudie Flakes, MD  oxyCODONE-acetaminophen (PERCOCET) 7.5-325 MG tablet Take 1 tablet by mouth every 6 (six) hours as needed for severe pain. 01/12/22   Triplett, Tammy, PA-C  sucralfate (CARAFATE) 1 GM/10ML suspension Take 10 mLs (1 g total) by mouth 3 (three) times daily as needed. 12/09/20   Janora Norlander, DO      Allergies    Patient has no known allergies.    Review of Systems   Review of Systems  Physical Exam Updated Vital Signs BP (!) 152/79   Pulse 87   Temp 98.4 F (36.9 C) (Oral)   Resp 18   Ht 5' 4.5" (1.638 m)   Wt 105.2 kg   LMP  (LMP Unknown)   SpO2 98%  BMI 39.21 kg/m  Physical Exam Vitals and nursing note reviewed.  Constitutional:      General: She is not in acute distress.    Appearance: She is well-developed.  HENT:     Head: Normocephalic and atraumatic.     Mouth/Throat:     Mouth: Mucous membranes are moist.  Eyes:     General: Vision grossly intact. Gaze aligned appropriately.     Extraocular Movements: Extraocular movements intact.     Conjunctiva/sclera: Conjunctivae normal.  Cardiovascular:     Rate and Rhythm: Normal rate and regular rhythm.     Pulses: Normal pulses.     Heart sounds: Normal heart sounds, S1 normal and S2 normal. No murmur heard.    No friction rub. No gallop.  Pulmonary:     Effort: Pulmonary effort is normal.  No respiratory distress.     Breath sounds: Normal breath sounds.  Abdominal:     General: Bowel sounds are normal.     Palpations: Abdomen is soft.     Tenderness: There is no abdominal tenderness. There is no guarding or rebound.     Hernia: No hernia is present.  Musculoskeletal:        General: No swelling.     Cervical back: Full passive range of motion without pain, normal range of motion and neck supple. No spinous process tenderness or muscular tenderness. Normal range of motion.     Lumbar back: Spasms and tenderness present.       Back:     Right lower leg: No edema.     Left lower leg: No edema.  Skin:    General: Skin is warm and dry.     Capillary Refill: Capillary refill takes less than 2 seconds.     Findings: No ecchymosis, erythema, rash or wound.  Neurological:     General: No focal deficit present.     Mental Status: She is alert and oriented to person, place, and time.     GCS: GCS eye subscore is 4. GCS verbal subscore is 5. GCS motor subscore is 6.     Cranial Nerves: Cranial nerves 2-12 are intact.     Sensory: Sensation is intact.     Motor: Motor function is intact.     Coordination: Coordination is intact.  Psychiatric:        Attention and Perception: Attention normal.        Mood and Affect: Mood normal.        Speech: Speech normal.        Behavior: Behavior normal.     ED Results / Procedures / Treatments   Labs (all labs ordered are listed, but only abnormal results are displayed) Labs Reviewed  URINALYSIS, ROUTINE W REFLEX MICROSCOPIC - Abnormal; Notable for the following components:      Result Value   APPearance HAZY (*)    All other components within normal limits  PREGNANCY, URINE    EKG None  Radiology CT RENAL STONE STUDY  Result Date: 01/19/2022 CLINICAL DATA:  Flank pain.  Concern for kidney stone. EXAM: CT ABDOMEN AND PELVIS WITHOUT CONTRAST TECHNIQUE: Multidetector CT imaging of the abdomen and pelvis was performed  following the standard protocol without IV contrast. RADIATION DOSE REDUCTION: This exam was performed according to the departmental dose-optimization program which includes automated exposure control, adjustment of the mA and/or kV according to patient size and/or use of iterative reconstruction technique. COMPARISON:  None Available. FINDINGS: Evaluation of this exam is limited  in the absence of intravenous contrast. Lower chest: The visualized lung bases are clear. No intra-abdominal free air or free fluid. Hepatobiliary: Fatty liver. No intrahepatic biliary dilatation. The gallbladder is unremarkable. Pancreas: Unremarkable. No pancreatic ductal dilatation or surrounding inflammatory changes. Spleen: Normal in size without focal abnormality. Adrenals/Urinary Tract: The kidneys, visualized ureters, and urinary bladder appear unremarkable. Stomach/Bowel: There is sigmoid diverticulosis without active inflammatory changes. There is moderate stool throughout the colon. There is no bowel obstruction or active inflammation. The appendix is normal. Vascular/Lymphatic: The abdominal aorta and IVC are grossly unremarkable on this noncontrast CT. No portal venous gas. There is no adenopathy. There is haziness of the mesentery with several top-normal lymph nodes, nonspecific. Reproductive: The uterus is anteverted. An intrauterine device is noted. No adnexal masses. Other: None Musculoskeletal: No acute or significant osseous findings. IMPRESSION: 1. No acute intra-abdominal or pelvic pathology. No hydronephrosis or nephrolithiasis. 2. Sigmoid diverticulosis without active inflammatory changes. 3. Fatty liver. Electronically Signed   By: Anner Crete M.D.   On: 01/19/2022 01:33    Procedures Procedures    Medications Ordered in ED Medications - No data to display  ED Course/ Medical Decision Making/ A&P                           Medical Decision Making Amount and/or Complexity of Data Reviewed Labs:  ordered. Radiology: ordered.   Presents to the emergency department for evaluation of right low back pain.  She was concerned that it was kidney related.  Patient's urinalysis does not suggest infection or have any other abnormalities.  Renal stone study performed to rule out intra-abdominal and GU pathology.  No acute findings present.  Patient counseled that it is likely musculoskeletal in nature and she needs to follow-up with primary care, possibly be referred to physical therapy.  It is not radicular and there are no red flags that would suggest need for MRI or neurosurgical intervention.        Final Clinical Impression(s) / ED Diagnoses Final diagnoses:  Right-sided low back pain without sciatica, unspecified chronicity    Rx / DC Orders ED Discharge Orders     None         Eastyn Dattilo, Gwenyth Allegra, MD 01/19/22 [phone]

## 2022-01-20 ENCOUNTER — Telehealth: Payer: Self-pay | Admitting: Internal Medicine

## 2022-01-20 NOTE — Telephone Encounter (Signed)
Patient scheduled for procedure 6/21. Patient states she is currently experiencing diarrhea at the moment. Patient inquiring if she should still come in for her procedure. Please give patient a call back to advise.  Thank you

## 2022-01-20 NOTE — Telephone Encounter (Signed)
Instructed pt that if diarrhea does not resolve she should see her PCP for evaluation. Pt asked if she could take imodium now since she was not going to prep, RN stated that should be fine.

## 2022-01-20 NOTE — Telephone Encounter (Signed)
Returned pt's call. States she has had diarrhea that started on Friday and has continued through to today. States she has already had about 3-4 BM's today so far; unable to recall how many times per day she had been on the other days prior. States her stools are brown liquid. Reports she felt achy all over a few days ago but that only lasted one day and has since resolved. Denies fevers, reports she has been checking with a thermometer. States her son had similar symptoms that started last Monday but they have since resolved prior to her symptoms starting on Friday. States she had some slight abdominal pain a few days ago and took some pepto bismol. She did not try imodium. Pt denies starting any new medications recently. Wants to know if she should still come for her colonoscopy tomorrow at 2:30 with Dr. Henrene Pastor. Dr. Ardis Hughs, Dr. Henrene Pastor is not here today. Can you please advise?  FYI: pt seen in ED a couple of days ago for right flank pain. CT scan performed and was negative for kidney stones. Pt was told it was musculoskeletal.

## 2022-01-20 NOTE — Telephone Encounter (Signed)
Called pt back to inform her of MD recommendations. Pt agreeable. Cancelled procedure for 01/21/22. 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 Unable to establish a date to reschedule at this time. Pt is to call back once she speaks with her daughter.

## 2022-01-21 ENCOUNTER — Encounter: Payer: Medicaid Other | Admitting: Internal Medicine

## 2022-01-30 DIAGNOSIS — G8929 Other chronic pain: Secondary | ICD-10-CM | POA: Diagnosis not present

## 2022-01-30 DIAGNOSIS — M25511 Pain in right shoulder: Secondary | ICD-10-CM | POA: Diagnosis not present

## 2022-01-31 DIAGNOSIS — Z419 Encounter for procedure for purposes other than remedying health state, unspecified: Secondary | ICD-10-CM | POA: Diagnosis not present

## 2022-02-06 DIAGNOSIS — M25531 Pain in right wrist: Secondary | ICD-10-CM | POA: Diagnosis not present

## 2022-02-10 ENCOUNTER — Ambulatory Visit (INDEPENDENT_AMBULATORY_CARE_PROVIDER_SITE_OTHER): Payer: Medicaid Other | Admitting: Family

## 2022-02-10 ENCOUNTER — Encounter: Payer: Self-pay | Admitting: Family

## 2022-02-10 DIAGNOSIS — J029 Acute pharyngitis, unspecified: Secondary | ICD-10-CM | POA: Diagnosis not present

## 2022-02-10 DIAGNOSIS — B370 Candidal stomatitis: Secondary | ICD-10-CM

## 2022-02-10 LAB — CULTURE, GROUP A STREP

## 2022-02-10 LAB — RAPID STREP SCREEN (MED CTR MEBANE ONLY): Strep Gp A Ag, IA W/Reflex: NEGATIVE

## 2022-02-10 MED ORDER — NYSTATIN 100000 UNIT/ML MT SUSP: 5.0000 mL | Freq: Four times a day (QID) | OROMUCOSAL | 0 refills | Status: DC

## 2022-02-10 MED ORDER — CLOTRIMAZOLE 10 MG MT TROC: 10.0000 mg | Freq: Every day | OROMUCOSAL | 1 refills | Status: DC

## 2022-02-10 NOTE — Progress Notes (Signed)
   Subjective:    Patient ID: Julie Alvarado, female    DOB: Mar 16, 1971, 51 y.o.   MRN: [id number]  Chief Complaint  Patient presents with   Julie Alvarado    HPI PT presents to the office today with a "bad taste in mouth" for over a week, then Saturday she noticed a white coating. She states she has mild throat pain of 4-5 out 10. She has not taken anything, except brushing her teeth.   States she had oral steroids a little over a month ago.   She has GERD and takes Pepcid 20 mg BID. States this is well controlled.    Review of Systems  All other systems reviewed and are negative.      Objective:   Physical Exam Vitals reviewed.  Constitutional:      General: She is not in acute distress.    Appearance: She is well-developed. She is obese.  HENT:     Head: Normocephalic and atraumatic.     Right Ear: Tympanic membrane normal.     Left Ear: Tympanic membrane normal.     Mouth/Throat:     Pharynx: Uvula swelling present.     Comments: Uvula swelling and erythemas  Eyes:     Pupils: Pupils are equal, round, and reactive to light.  Neck:     Thyroid: No thyromegaly.  Cardiovascular:     Rate and Rhythm: Normal rate and regular rhythm.     Heart sounds: Normal heart sounds. No murmur heard. Pulmonary:     Effort: Pulmonary effort is normal. No respiratory distress.     Breath sounds: Normal breath sounds. No wheezing.  Abdominal:     General: Bowel sounds are normal. There is no distension.     Palpations: Abdomen is soft.     Tenderness: There is no abdominal tenderness.  Musculoskeletal:        General: No tenderness. Normal range of motion.     Cervical back: Normal range of motion and neck supple.  Skin:    General: Skin is warm and dry.  Neurological:     Mental Status: She is alert and oriented to person, place, and time.     Cranial Nerves: No cranial nerve deficit.     Deep Tendon Reflexes: Reflexes are normal and symmetric.  Psychiatric:        Behavior: Behavior  normal.        Thought Content: Thought content normal.        Judgment: Judgment normal.      BP 112/68   Pulse 77   Temp 98.2 F (36.8 C) (Temporal)   Ht 5' 4.5" (1.638 m)   Wt 229 lb 8 oz (104.1 kg)   LMP  (LMP Unknown)   SpO2 98%   BMI 38.79 kg/m       Assessment & Plan:  Julie Alvarado comes in today with chief complaint of Thrush   Diagnosis and orders addressed:  1. Oral thrush Nystatin oral  Rinse mouth after meals Encourage yogurt - clotrimazole (MYCELEX) 10 MG troche; Take 1 tablet (10 mg total) by mouth 5 (five) times daily.  Dispense: 140 Troche; Refill: 1 - nystatin (MYCOSTATIN) 100000 UNIT/ML suspension; Take 5 mLs (500,000 Units total) by mouth 4 (four) times daily.  Dispense: 60 mL; Refill: 0  2. Sore throat - Rapid Strep Screen ([facility] ONLY)      Julie Dun, FNP

## 2022-02-10 NOTE — Patient Instructions (Signed)

## 2022-03-03 DIAGNOSIS — Z419 Encounter for procedure for purposes other than remedying health state, unspecified: Secondary | ICD-10-CM | POA: Diagnosis not present

## 2022-03-16 DIAGNOSIS — Z01818 Encounter for other preprocedural examination: Secondary | ICD-10-CM | POA: Diagnosis not present

## 2022-03-17 DIAGNOSIS — E039 Hypothyroidism, unspecified: Secondary | ICD-10-CM | POA: Diagnosis not present

## 2022-03-17 DIAGNOSIS — Z79890 Hormone replacement therapy (postmenopausal): Secondary | ICD-10-CM | POA: Diagnosis not present

## 2022-03-17 DIAGNOSIS — G5601 Carpal tunnel syndrome, right upper limb: Secondary | ICD-10-CM | POA: Diagnosis not present

## 2022-03-17 DIAGNOSIS — F17200 Nicotine dependence, unspecified, uncomplicated: Secondary | ICD-10-CM | POA: Diagnosis not present

## 2022-03-17 DIAGNOSIS — K219 Gastro-esophageal reflux disease without esophagitis: Secondary | ICD-10-CM | POA: Diagnosis not present

## 2022-03-17 DIAGNOSIS — Z79899 Other long term (current) drug therapy: Secondary | ICD-10-CM | POA: Diagnosis not present

## 2022-03-30 ENCOUNTER — Ambulatory Visit: Payer: Medicaid Other | Admitting: Family Medicine

## 2022-04-01 ENCOUNTER — Encounter: Payer: Self-pay | Admitting: Family Medicine

## 2022-04-01 ENCOUNTER — Ambulatory Visit (INDEPENDENT_AMBULATORY_CARE_PROVIDER_SITE_OTHER): Payer: Medicaid Other | Admitting: Family Medicine

## 2022-04-01 VITALS — BP 130/77 | HR 86 | Temp 98.0°F | Resp 20 | Ht 64.0 in | Wt 228.0 lb

## 2022-04-01 DIAGNOSIS — F439 Reaction to severe stress, unspecified: Secondary | ICD-10-CM | POA: Diagnosis not present

## 2022-04-01 DIAGNOSIS — B370 Candidal stomatitis: Secondary | ICD-10-CM

## 2022-04-01 DIAGNOSIS — F900 Attention-deficit hyperactivity disorder, predominantly inattentive type: Secondary | ICD-10-CM | POA: Diagnosis not present

## 2022-04-01 DIAGNOSIS — Z23 Encounter for immunization: Secondary | ICD-10-CM | POA: Diagnosis not present

## 2022-04-01 DIAGNOSIS — F329 Major depressive disorder, single episode, unspecified: Secondary | ICD-10-CM | POA: Diagnosis not present

## 2022-04-01 MED ORDER — AMPHETAMINE-DEXTROAMPHETAMINE 10 MG PO TABS
ORAL_TABLET | ORAL | 0 refills | Status: DC
Start: 2022-06-14 — End: 2022-07-03

## 2022-04-01 MED ORDER — NYSTATIN 100000 UNIT/ML MT SUSP: 5.0000 mL | Freq: Four times a day (QID) | OROMUCOSAL | 0 refills | Status: DC

## 2022-04-01 MED ORDER — AMPHETAMINE-DEXTROAMPHETAMINE 10 MG PO TABS: ORAL_TABLET | ORAL | 0 refills | Status: DC

## 2022-04-01 MED ORDER — BUPROPION HCL ER (XL) 150 MG PO TB24: 150.0000 mg | ORAL_TABLET | Freq: Every day | ORAL | 3 refills | Status: DC

## 2022-04-01 NOTE — Patient Instructions (Signed)
Oral Thrush, Adult Oral thrush is an infection in your mouth and throat and on your tongue. It causes white patches to form in your mouth and on your tongue. Many cases of thrush are mild. But, sometimes, thrush can be serious. People who have a weak body defense system (immune system) or other diseases can be affected more. What are the causes? This condition is caused by a type of fungus called yeast. The fungus is normally present in small amounts in the mouth and nose. If a person has a long-term illness or a weak body defense system, the fungus can grow and spread quickly. This causes thrush. What increases the risk? You are more likely to develop this condition if: You have a weak body defense system. You are an older adult. You have diabetes, cancer, or HIV. You have a dry mouth. You are pregnant or breastfeeding. You do not take good care of your teeth. This risk is greater for people who have false teeth (dentures). You use antibiotic or steroid medicines. What are the signs or symptoms? Symptoms of this condition include: A burning feeling in the mouth and throat. White patches that stick to the mouth and tongue. A bad taste in the mouth or trouble tasting foods. A feeling like you have cotton in your mouth. Pain when you eat and swallow. Not wanting to eat as much as usual. Cracking at the corners of the mouth. How is this treated? This condition is treated with medicines called antifungals. These medicines prevent a fungus from growing. The medicines are either put right on the area (topical) or swallowed (oral). Your doctor will also treat other problems that you may have, such as diabetes or HIV. Follow these instructions at home: Helping with pain and soreness To lessen your pain: Drink cold liquids, like water and iced tea. Eat frozen ice pops or frozen juices. Eat foods that are easy to swallow, like gelatin and ice cream. Drink from a straw if you have too much pain  in your mouth.  General instructions Take or use over-the-counter and prescription medicines only as told by your doctor. Eat plain yogurt that has live cultures in it. Read the label to make sure that there are live cultures in your yogurt. If you wear false teeth: Take them out before you go to bed. Brush them well. Soak them in a cleaner. Rinse your mouth with warm salt-water many times a day. To make the salt-water mixture, dissolve -1 teaspoon (3-6 g) of salt in 1 cup (237 mL) of warm water. Contact a doctor if: Your problems do not get better within 7 days of treatment. Your infection is spreading. This may show as white areas on the skin outside of your mouth. You are nursing your baby and you have redness and pain in the nipples. Summary Oral thrush is an infection in your mouth and throat. It is caused by a fungus. You are more likely to get this condition if you have a weak body defense system. Diseases like diabetes, cancer, or HIV also add to your risk. This condition is treated with medicines called antifungals. Contact a doctor if you do not get better within 7 days of starting treatment. This information is not intended to replace advice given to you by your health care provider. Make sure you discuss any questions you have with your health care provider. Document Revised: 03/19/2021 Document Reviewed: 05/26/2019 Elsevier Patient Education  2023 Elsevier Inc.  

## 2022-04-01 NOTE — Addendum Note (Signed)
Addended by: Rolena Infante on: 04/01/2022 03:54 PM   Modules accepted: Orders

## 2022-04-01 NOTE — Progress Notes (Signed)
Subjective: Julie Alvarado PCP: Janora Norlander, DO Julie Alvarado is a 51 y.o. female presenting to clinic today for:  1. ADHD She reports stability of ADHD symptoms.  No concerning side effects from the Vyvanse.  She has chronic dry mouth  2.  Thrush Patient does report having started developing thrush in her mouth after she had antibiotics for her carpal tunnel syndrome surgery on the right wrist.  She had some leftover clotrimazole atrocious but would prefer to go back to nystatin as this is more easily remembered than 5 times per day clotrimazole  3.  Reactive depression Patient continues to care for her father.  He unfortunately was admitted to the hospital recently for pneumonia.  She is quite stressed about the need for help and does worry about finances.  She does feel that the Wellbutrin is working to help with depression but she feels numb and does not like to feel that way so she would like to reduce the dose back to 150 mg   ROS: Per HPI  No Known Allergies Past Medical History:  Diagnosis Date   Allergy    Anxiety    Asthma    Blood transfusion without reported diagnosis    Depression    GERD (gastroesophageal reflux disease)    Thyroid disease     Current Outpatient Medications:    amphetamine-dextroamphetamine (ADDERALL) 10 MG tablet, Take '20mg'$  every morning and '10mg'$  every afternoon, Disp: 90 tablet, Rfl: 0   amphetamine-dextroamphetamine (ADDERALL) 10 MG tablet, Take '20mg'$  each morning and '10mg'$  each afternoon, Disp: 90 tablet, Rfl: 0   amphetamine-dextroamphetamine (ADDERALL) 10 MG tablet, Take '20mg'$  each morning and '10mg'$  each afternoon, Disp: 90 tablet, Rfl: 0   b complex vitamins capsule, Take 1 capsule by mouth daily., Disp: , Rfl:    buPROPion (WELLBUTRIN XL) 300 MG 24 hr tablet, Take 1 tablet (300 mg total) by mouth daily., Disp: 90 tablet, Rfl: 3   clotrimazole (MYCELEX) 10 MG troche, Take 1 tablet (10 mg total) by mouth 5 (five) times daily., Disp: 140  Troche, Rfl: 1   famotidine (PEPCID) 20 MG tablet, Take 20 mg by mouth daily., Disp: , Rfl:    fexofenadine (ALLEGRA) 180 MG tablet, Take 180 mg by mouth daily., Disp: , Rfl:    FISH OIL-KRILL OIL PO, Take by mouth., Disp: , Rfl:    gabapentin (NEURONTIN) 300 MG capsule, Take 300 mg by mouth 3 (three) times daily., Disp: , Rfl:    levothyroxine (SYNTHROID) 175 MCG tablet, TAKE ONE TABLET BY MOUTH DAILY BEFORE BREAKFAST, Disp: 90 tablet, Rfl: 3   lidocaine (LIDODERM) 5 %, Place 1 patch onto the skin daily. Remove & Discard patch within 12 hours or as directed by MD, Disp: 5 patch, Rfl: 0   methocarbamol (ROBAXIN) 500 MG tablet, Take 1 tablet (500 mg total) by mouth every 8 (eight) hours as needed for muscle spasms., Disp: 30 tablet, Rfl: 0   Multiple Vitamin (MULTIVITAMIN) tablet, Take 1 tablet by mouth daily., Disp: , Rfl:    nystatin (MYCOSTATIN) 100000 UNIT/ML suspension, Take 5 mLs (500,000 Units total) by mouth 4 (four) times daily., Disp: 60 mL, Rfl: 0   OVER THE COUNTER MEDICATION, Vitamin D 3 2000 IU one capsule daily., Disp: , Rfl:    sucralfate (CARAFATE) 1 GM/10ML suspension, Take 10 mLs (1 g total) by mouth 3 (three) times daily as needed., Disp: 420 mL, Rfl: 0 Social History   Socioeconomic History   Marital status: Single  Spouse name: Not on file   Number of children: Not on file   Years of education: Not on file   Highest education level: Not on file  Occupational History   Not on file  Tobacco Use   Smoking status: Every Day    Packs/day: 0.20    Years: 15.00    Total pack years: 3.00    Types: Cigarettes   Smokeless tobacco: Never  Vaping Use   Vaping Use: Never used  Substance and Sexual Activity   Alcohol use: Not Currently    Comment: Rarely   Drug use: Never   Sexual activity: Yes    Birth control/protection: I.U.D.  Other Topics Concern   Not on file  Social History Narrative   Not on file   Social Determinants of Health   Financial Resource Strain:  Not on file  Food Insecurity: Not on file  Transportation Needs: Not on file  Physical Activity: Not on file  Stress: Not on file  Social Connections: Not on file  Intimate Partner Violence: Not on file   Family History  Problem Relation Age of Onset   Cancer Mother    COPD Mother    Pancreatic cancer Father    COPD Father    Cancer Maternal Grandmother    COPD Maternal Grandmother    Colon cancer Neg Hx    Esophageal cancer Neg Hx    Rectal cancer Neg Hx    Stomach cancer Neg Hx     Objective: Office vital signs reviewed. BP 130/77   Pulse 86   Temp 98 F (36.7 C) (Temporal)   Resp 20   Ht '5\' 4"'$  (1.626 m)   Wt 228 lb (103.4 kg)   SpO2 96%   BMI 39.14 kg/m   Physical Examination:  General: Awake, alert, well nourished, No acute distress HEENT: Scant white creamy substance along the tongue Cardio: regular rate and rhythm, S1S2 heard, no murmurs appreciated Pulm: clear to auscultation bilaterally, no wheezes, rhonchi or rales; normal work of breathing on room air Psych: Mood stable, speech normal, affect appropriate  Assessment/ Plan: 51 y.o. female   Attention deficit hyperactivity disorder (ADHD), predominantly inattentive type - Plan: amphetamine-dextroamphetamine (ADDERALL) 10 MG tablet, amphetamine-dextroamphetamine (ADDERALL) 10 MG tablet, amphetamine-dextroamphetamine (ADDERALL) 10 MG tablet  Oral thrush - Plan: nystatin (MYCOSTATIN) 100000 UNIT/ML suspension  Situational stress - Plan: buPROPion (WELLBUTRIN XL) 150 MG 24 hr tablet  Reactive depression - Plan: buPROPion (WELLBUTRIN XL) 150 MG 24 hr tablet  ADHD stable.  UDS and CSA are up-to-date national narcotic database reviewed and there were no red flags  Nystatin sent in for oral thrush  Wellbutrin reduced to 150 mg  Follow-up in 3 months  No orders of the defined types were placed in this encounter.  No orders of the defined types were placed in this encounter.  1st shingles vaccine  administered   Janora Norlander, DO [place] [phone]

## 2022-04-03 DIAGNOSIS — Z419 Encounter for procedure for purposes other than remedying health state, unspecified: Secondary | ICD-10-CM | POA: Diagnosis not present

## 2022-05-03 DIAGNOSIS — Z419 Encounter for procedure for purposes other than remedying health state, unspecified: Secondary | ICD-10-CM | POA: Diagnosis not present

## 2022-06-03 DIAGNOSIS — Z419 Encounter for procedure for purposes other than remedying health state, unspecified: Secondary | ICD-10-CM | POA: Diagnosis not present

## 2022-06-10 DIAGNOSIS — M47816 Spondylosis without myelopathy or radiculopathy, lumbar region: Secondary | ICD-10-CM | POA: Diagnosis not present

## 2022-06-10 DIAGNOSIS — G8929 Other chronic pain: Secondary | ICD-10-CM | POA: Diagnosis not present

## 2022-06-10 DIAGNOSIS — M47812 Spondylosis without myelopathy or radiculopathy, cervical region: Secondary | ICD-10-CM | POA: Diagnosis not present

## 2022-06-10 DIAGNOSIS — M25512 Pain in left shoulder: Secondary | ICD-10-CM | POA: Diagnosis not present

## 2022-06-10 DIAGNOSIS — M5030 Other cervical disc degeneration, unspecified cervical region: Secondary | ICD-10-CM | POA: Diagnosis not present

## 2022-07-03 ENCOUNTER — Encounter: Payer: Self-pay | Admitting: Family Medicine

## 2022-07-03 ENCOUNTER — Ambulatory Visit: Payer: Medicaid Other | Admitting: Family Medicine

## 2022-07-03 VITALS — BP 122/69 | HR 81 | Temp 98.2°F | Ht 64.0 in | Wt 228.8 lb

## 2022-07-03 DIAGNOSIS — F900 Attention-deficit hyperactivity disorder, predominantly inattentive type: Secondary | ICD-10-CM | POA: Diagnosis not present

## 2022-07-03 DIAGNOSIS — Z79899 Other long term (current) drug therapy: Secondary | ICD-10-CM | POA: Diagnosis not present

## 2022-07-03 DIAGNOSIS — E038 Other specified hypothyroidism: Secondary | ICD-10-CM

## 2022-07-03 DIAGNOSIS — E063 Autoimmune thyroiditis: Secondary | ICD-10-CM | POA: Diagnosis not present

## 2022-07-03 DIAGNOSIS — Z419 Encounter for procedure for purposes other than remedying health state, unspecified: Secondary | ICD-10-CM | POA: Diagnosis not present

## 2022-07-03 DIAGNOSIS — F329 Major depressive disorder, single episode, unspecified: Secondary | ICD-10-CM

## 2022-07-03 DIAGNOSIS — Z23 Encounter for immunization: Secondary | ICD-10-CM | POA: Diagnosis not present

## 2022-07-03 MED ORDER — LISDEXAMFETAMINE DIMESYLATE 50 MG PO CAPS: 50.0000 mg | ORAL_CAPSULE | Freq: Every day | ORAL | 0 refills | Status: DC

## 2022-07-03 NOTE — Progress Notes (Signed)
Subjective: CC: Follow-up ADHD PCP: Julie Norlander, DO DGU:YQIH [name] is a 51 y.o. female presenting to clinic today for:  1.  ADHD, reactive depression.  Hypothyroidism Patient reports that the Adderall still works fine for her.  She does find that when she takes the evening dose, it can be somewhat tricky depending on when she actually takes it.  She always has dry mouth but hydrates without difficulty.  She did not take it this morning because she was trying to get out of the door to drop her son off at school.  She is now taking CBD Gummies for anxiety and this seems to be working well for her.  No reports of increased anxiety, heart palpitations or tremor.  In fact symptoms seem to be slightly better since she reduced her Wellbutrin dose.  Overall things seem to be going well with both she and her father, who is back at home now.   ROS: Per HPI  No Known Allergies Past Medical History:  Diagnosis Date   Allergy    Anxiety    Asthma    Blood transfusion without reported diagnosis    Depression    GERD (gastroesophageal reflux disease)    Thyroid disease     Current Outpatient Medications:    amphetamine-dextroamphetamine (ADDERALL) 10 MG tablet, Take '20mg'$  every morning and '10mg'$  every afternoon, Disp: 90 tablet, Rfl: 0   b complex vitamins capsule, Take 1 capsule by mouth daily., Disp: , Rfl:    buPROPion (WELLBUTRIN XL) 150 MG 24 hr tablet, Take 1 tablet (150 mg total) by mouth daily. Dose reduction, Disp: 90 tablet, Rfl: 3   famotidine (PEPCID) 20 MG tablet, Take 20 mg by mouth daily., Disp: , Rfl:    fexofenadine (ALLEGRA) 180 MG tablet, Take 180 mg by mouth daily., Disp: , Rfl:    FISH OIL-KRILL OIL PO, Take by mouth., Disp: , Rfl:    gabapentin (NEURONTIN) 300 MG capsule, Take 300 mg by mouth 3 (three) times daily., Disp: , Rfl:    levothyroxine (SYNTHROID) 175 MCG tablet, TAKE ONE TABLET BY MOUTH DAILY BEFORE BREAKFAST, Disp: 90 tablet, Rfl: 3   lidocaine  (LIDODERM) 5 %, Place 1 patch onto the skin daily. Remove & Discard patch within 12 hours or as directed by MD, Disp: 5 patch, Rfl: 0   methocarbamol (ROBAXIN) 500 MG tablet, Take 1 tablet (500 mg total) by mouth every 8 (eight) hours as needed for muscle spasms., Disp: 30 tablet, Rfl: 0   Multiple Vitamin (MULTIVITAMIN) tablet, Take 1 tablet by mouth daily., Disp: , Rfl:    nystatin (MYCOSTATIN) 100000 UNIT/ML suspension, Take 5 mLs (500,000 Units total) by mouth 4 (four) times daily., Disp: 240 mL, Rfl: 0   OVER THE COUNTER MEDICATION, Vitamin D 3 2000 IU one capsule daily., Disp: , Rfl:    sucralfate (CARAFATE) 1 GM/10ML suspension, Take 10 mLs (1 g total) by mouth 3 (three) times daily as needed., Disp: 420 mL, Rfl: 0   amphetamine-dextroamphetamine (ADDERALL) 10 MG tablet, Take '20mg'$  each morning and '10mg'$  each afternoon (Patient not taking: Reported on 07/03/2022), Disp: 90 tablet, Rfl: 0   amphetamine-dextroamphetamine (ADDERALL) 10 MG tablet, Take '20mg'$  each morning and '10mg'$  each afternoon (Patient not taking: Reported on 07/03/2022), Disp: 90 tablet, Rfl: 0 Social History   Socioeconomic History   Marital status: Single    Spouse name: Not on file   Number of children: Not on file   Years of education: Not on file  Highest education level: Not on file  Occupational History   Not on file  Tobacco Use   Smoking status: Every Day    Packs/day: 0.20    Years: 15.00    Total pack years: 3.00    Types: Cigarettes   Smokeless tobacco: Never  Vaping Use   Vaping Use: Never used  Substance and Sexual Activity   Alcohol use: Not Currently    Comment: Rarely   Drug use: Never   Sexual activity: Yes    Birth control/protection: I.U.D.  Other Topics Concern   Not on file  Social History Narrative   Not on file   Social Determinants of Health   Financial Resource Strain: Not on file  Food Insecurity: Not on file  Transportation Needs: Not on file  Physical Activity: Not on file   Stress: Not on file  Social Connections: Not on file  Intimate Partner Violence: Not on file   Family History  Problem Relation Age of Onset   Cancer Mother    COPD Mother    Pancreatic cancer Father    COPD Father    Cancer Maternal Grandmother    COPD Maternal Grandmother    Colon cancer Neg Hx    Esophageal cancer Neg Hx    Rectal cancer Neg Hx    Stomach cancer Neg Hx     Objective: Office vital signs reviewed. BP 122/69   Pulse 81   Temp 98.2 F (36.8 C)   Ht '5\' 4"'$  (1.626 m)   Wt 228 lb 12.8 oz (103.8 kg)   SpO2 99%   BMI 39.27 kg/m   Physical Examination:  General: Awake, alert, nontoxic obese female, No acute distress HEENT: No exophthalmos.  No goiter Cardio: regular rate and rhythm, S1S2 heard, no murmurs appreciated Pulm: clear to auscultation bilaterally, no wheezes, rhonchi or rales; normal work of breathing on room air Neuro: No tremor Psych: Very pleasant, interactive.  Mood stable     07/03/2022    9:52 AM 04/01/2022    1:33 PM 02/10/2022   10:06 AM  Depression screen PHQ 2/9  Decreased Interest 0 1 2  Down, Depressed, Hopeless '1 1 2  '$ PHQ - 2 Score '1 2 4  '$ Altered sleeping 0 1 3  Tired, decreased energy '1 1 3  '$ Change in appetite '1 2 3  '$ Feeling bad or failure about yourself  0 2 1  Trouble concentrating 0 1 0  Moving slowly or fidgety/restless 0 0 0  Suicidal thoughts 0 0 0  PHQ-9 Score '3 9 14  '$ Difficult doing work/chores Not difficult at all Somewhat difficult Somewhat difficult      07/03/2022    9:52 AM 04/01/2022    1:34 PM 02/10/2022   10:07 AM 12/25/2021    3:02 PM  GAD 7 : Generalized Anxiety Score  Nervous, Anxious, on Edge 0 '2 3 2  '$ Control/stop worrying 0 '2 3 2  '$ Worry too much - different things 0 '3 3 3  '$ Trouble relaxing '1 3 3 3  '$ Restless 0 0 0 0  Easily annoyed or irritable '1 1 1 1  '$ Afraid - awful might happen '1 3 3 3  '$ Total GAD 7 Score '3 14 16 14  '$ Anxiety Difficulty Somewhat difficult Somewhat difficult Somewhat difficult  Somewhat difficult     Assessment/ Plan: 51 y.o. female   Attention deficit hyperactivity disorder (ADHD), predominantly inattentive type - Plan: ToxASSURE Select 13 (MW), Urine, lisdexamfetamine (VYVANSE) 50 MG capsule  Reactive depression  Controlled substance agreement signed - Plan: ToxASSURE Select 13 (MW), Urine  Hypothyroidism due to Hashimoto's thyroiditis - Plan: TSH, T4, Free  Need for immunization against influenza - Plan: Flu Vaccine QUAD 37moIM (Fluarix, Fluzone & Alfiuria Quad PF)  Trial of Vyvanse.  May need to consider advancing to up to 70 mg pending her response.  UDS and CSC were updated as per office policy.  National narcotic database reviewed and there were no red flags.  We will follow-up in 1 month, sooner if concerns arise  She is doing better from a depressive standpoint.  We discussed potential wean if she desires going forward from the Wellbutrin.  For now we will continue current regimen  Check thyroid levels.  Influenza vaccination ministered  No orders of the defined types were placed in this encounter.  No orders of the defined types were placed in this encounter.    AJanora Norlander DO [place]([phone]

## 2022-07-07 LAB — TOXASSURE SELECT 13 (MW), URINE

## 2022-08-03 DIAGNOSIS — Z419 Encounter for procedure for purposes other than remedying health state, unspecified: Secondary | ICD-10-CM | POA: Diagnosis not present

## 2022-08-04 ENCOUNTER — Encounter: Payer: Self-pay | Admitting: Family Medicine

## 2022-08-04 ENCOUNTER — Ambulatory Visit: Payer: Medicaid Other | Admitting: Family Medicine

## 2022-08-04 VITALS — BP 133/76 | HR 86 | Temp 98.0°F | Ht 64.0 in | Wt 227.4 lb

## 2022-08-04 DIAGNOSIS — F900 Attention-deficit hyperactivity disorder, predominantly inattentive type: Secondary | ICD-10-CM | POA: Diagnosis not present

## 2022-08-04 MED ORDER — AMPHETAMINE-DEXTROAMPHETAMINE 10 MG PO TABS: ORAL_TABLET | ORAL | 0 refills | Status: DC

## 2022-08-04 NOTE — Progress Notes (Signed)
Subjective: CC: Follow-up ADHD PCP: Julie Norlander, DO JXB:JYNW [place] is a 52 y.o. female presenting to clinic today for:  1.  ADHD We attempted to replace her Adderall with Vyvanse last visit.  She notes that she felt on edge and slightly irritable on the Vyvanse so she would like to switch back to Adderall.  Hopefully she will not have any issues finding it.   ROS: Per HPI  No Known Allergies Past Medical History:  Diagnosis Date   Allergy    Anxiety    Asthma    Blood transfusion without reported diagnosis    Depression    GERD (gastroesophageal reflux disease)    Thyroid disease     Current Outpatient Medications:    b complex vitamins capsule, Take 1 capsule by mouth daily., Disp: , Rfl:    buPROPion (WELLBUTRIN XL) 150 MG 24 hr tablet, Take 1 tablet (150 mg total) by mouth daily. Dose reduction, Disp: 90 tablet, Rfl: 3   famotidine (PEPCID) 20 MG tablet, Take 20 mg by mouth daily., Disp: , Rfl:    fexofenadine (ALLEGRA) 180 MG tablet, Take 180 mg by mouth daily., Disp: , Rfl:    FISH OIL-KRILL OIL PO, Take by mouth., Disp: , Rfl:    gabapentin (NEURONTIN) 300 MG capsule, Take 300 mg by mouth 3 (three) times daily., Disp: , Rfl:    levothyroxine (SYNTHROID) 175 MCG tablet, TAKE ONE TABLET BY MOUTH DAILY BEFORE BREAKFAST, Disp: 90 tablet, Rfl: 3   lidocaine (LIDODERM) 5 %, Place 1 patch onto the skin daily. Remove & Discard patch within 12 hours or as directed by MD, Disp: 5 patch, Rfl: 0   lisdexamfetamine (VYVANSE) 50 MG capsule, Take 1 capsule (50 mg total) by mouth daily. To replace Adderall, Disp: 30 capsule, Rfl: 0   methocarbamol (ROBAXIN) 500 MG tablet, Take 1 tablet (500 mg total) by mouth every 8 (eight) hours as needed for muscle spasms., Disp: 30 tablet, Rfl: 0   Multiple Vitamin (MULTIVITAMIN) tablet, Take 1 tablet by mouth daily., Disp: , Rfl:    nystatin (MYCOSTATIN) 100000 UNIT/ML suspension, Take 5 mLs (500,000 Units total) by mouth 4 (four) times  daily., Disp: 240 mL, Rfl: 0   OVER THE COUNTER MEDICATION, Vitamin D 3 2000 IU one capsule daily., Disp: , Rfl:    sucralfate (CARAFATE) 1 GM/10ML suspension, Take 10 mLs (1 g total) by mouth 3 (three) times daily as needed., Disp: 420 mL, Rfl: 0 Social History   Socioeconomic History   Marital status: Single    Spouse name: Not on file   Number of children: Not on file   Years of education: Not on file   Highest education level: Not on file  Occupational History   Not on file  Tobacco Use   Smoking status: Every Day    Packs/day: 0.20    Years: 15.00    Total pack years: 3.00    Types: Cigarettes   Smokeless tobacco: Never  Vaping Use   Vaping Use: Never used  Substance and Sexual Activity   Alcohol use: Not Currently    Comment: Rarely   Drug use: Never   Sexual activity: Yes    Birth control/protection: I.U.D.  Other Topics Concern   Not on file  Social History Narrative   Not on file   Social Determinants of Health   Financial Resource Strain: Not on file  Food Insecurity: Not on file  Transportation Needs: Not on file  Physical Activity: Not  on file  Stress: Not on file  Social Connections: Not on file  Intimate Partner Violence: Not on file   Family History  Problem Relation Age of Onset   Cancer Mother    COPD Mother    Pancreatic cancer Father    COPD Father    Cancer Maternal Grandmother    COPD Maternal Grandmother    Colon cancer Neg Hx    Esophageal cancer Neg Hx    Rectal cancer Neg Hx    Stomach cancer Neg Hx     Objective: Office vital signs reviewed. BP 133/76   Pulse 86   Temp 98 F (36.7 C) (Temporal)   Ht '5\' 4"'$  (1.626 m)   Wt 227 lb 6.4 oz (103.1 kg)   SpO2 97%   BMI 39.03 kg/m   Physical Examination:  General: Awake, alert, well-appearing female, No acute distress HEENT: Sclera white.  Moist mucous membranes Cardio: regular rate   Pulm: Normal work of breathing on room air Neuro: No tremor observed.  Alert and  oriented Psych: Mood stable, speech normal.  Good eye contact.     08/04/2022    4:17 PM 07/03/2022    9:52 AM 04/01/2022    1:33 PM  Depression screen PHQ 2/9  Decreased Interest 0 0 1  Down, Depressed, Hopeless '1 1 1  '$ PHQ - 2 Score '1 1 2  '$ Altered sleeping 0 0 1  Tired, decreased energy '1 1 1  '$ Change in appetite '2 1 2  '$ Feeling bad or failure about yourself  2 0 2  Trouble concentrating 0 0 1  Moving slowly or fidgety/restless 0 0 0  Suicidal thoughts 0 0 0  PHQ-9 Score '6 3 9  '$ Difficult doing work/chores  Not difficult at all Somewhat difficult      08/04/2022    4:18 PM 07/03/2022    9:52 AM 04/01/2022    1:34 PM 02/10/2022   10:07 AM  GAD 7 : Generalized Anxiety Score  Nervous, Anxious, on Edge 0 0 2 3  Control/stop worrying 3 0 2 3  Worry too much - different things 3 0 3 3  Trouble relaxing '3 1 3 3  '$ Restless 0 0 0 0  Easily annoyed or irritable '1 1 1 1  '$ Afraid - awful might happen '3 1 3 3  '$ Total GAD 7 Score '13 3 14 16  '$ Anxiety Difficulty Somewhat difficult Somewhat difficult Somewhat difficult Somewhat difficult      Assessment/ Plan: 52 y.o. female   Attention deficit hyperactivity disorder (ADHD), predominantly inattentive type - Plan: amphetamine-dextroamphetamine (ADDERALL) 10 MG tablet, amphetamine-dextroamphetamine (ADDERALL) 10 MG tablet, amphetamine-dextroamphetamine (ADDERALL) 10 MG tablet  Unfortunately Vyvanse not effective.  Hopefully she can continue with the Adderall and finds this as this has been backordered previously.  I have reviewed the national narcotic database she is up-to-date on UDS and CSC.  She may follow-up in 3 months, sooner if concerns arise  No orders of the defined types were placed in this encounter.  No orders of the defined types were placed in this encounter.    Julie Norlander, DO [place] [phone]

## 2022-08-31 ENCOUNTER — Other Ambulatory Visit: Payer: Self-pay | Admitting: Family Medicine

## 2022-08-31 DIAGNOSIS — K219 Gastro-esophageal reflux disease without esophagitis: Secondary | ICD-10-CM

## 2022-09-03 DIAGNOSIS — Z419 Encounter for procedure for purposes other than remedying health state, unspecified: Secondary | ICD-10-CM | POA: Diagnosis not present

## 2022-09-10 ENCOUNTER — Encounter: Payer: Self-pay | Admitting: Family Medicine

## 2022-10-02 DIAGNOSIS — Z419 Encounter for procedure for purposes other than remedying health state, unspecified: Secondary | ICD-10-CM | POA: Diagnosis not present

## 2022-10-09 ENCOUNTER — Encounter: Payer: Self-pay | Admitting: Family Medicine

## 2022-10-09 ENCOUNTER — Telehealth (INDEPENDENT_AMBULATORY_CARE_PROVIDER_SITE_OTHER): Payer: Medicaid Other | Admitting: Family Medicine

## 2022-10-09 DIAGNOSIS — Z20822 Contact with and (suspected) exposure to covid-19: Secondary | ICD-10-CM

## 2022-10-09 DIAGNOSIS — J069 Acute upper respiratory infection, unspecified: Secondary | ICD-10-CM | POA: Diagnosis not present

## 2022-10-09 NOTE — Progress Notes (Signed)
   Virtual Visit via video Note   Due to COVID-19 pandemic this visit was conducted virtually. This visit type was conducted due to national recommendations for restrictions regarding the COVID-19 Pandemic (e.g. social distancing, sheltering in place) in an effort to limit this patient's exposure and mitigate transmission in our community. All issues noted in this document were discussed and addressed.  A physical exam was not performed with this format.  I connected with  Julie Alvarado  on 10/09/22 at 1304 by video and verified that I am speaking with the correct person using two identifiers. Julie Alvarado is currently located at home and no one is currently with her during the visit. The provider, Gwenlyn Perking, FNP is located in their office at time of visit.  I discussed the limitations, risks, security and privacy concerns of performing an evaluation and management service by video  and the availability of in person appointments. I also discussed with the patient that there may be a patient responsible charge related to this service. The patient expressed understanding and agreed to proceed.  CC: URI  History and Present Illness:  Julie Alvarado reports cough, congestion, sore throat, HA, fatigue, and cervical adenopathy that started yesterday. She denies fever, body aches, chills, nausea, vomiting, or diarrhea. Denies chest pain or shortness of breath. Her son tested positive for Covid yesterday. She has been taking tylenol.    ROS As per HPI.     Observations/Objective: Alert and oriented x 3. Non toxic appearing. Respirations unlabored. Able to speak in full sentences without difficulty. Normal mood and behavior.    Assessment and Plan: [name] was seen today for uri.  Diagnoses and all orders for this visit:  Exposure to COVID-19 virus Viral URI Discussed likely Covid infection given symptoms and household exposure. She declined coming to the office for testing and will do a home Covid  test instead. She will notify the office if the test is positive. Discussed symptomatic care, quarantine, and return precautions.     Follow Up Instructions: As needed.     I discussed the assessment and treatment plan with the patient. The patient was provided an opportunity to ask questions and all were answered. The patient agreed with the plan and demonstrated an understanding of the instructions.   The patient was advised to call back or seek an in-person evaluation if the symptoms worsen or if the condition fails to improve as anticipated.  The above assessment and management plan was discussed with the patient. The patient verbalized understanding of and has agreed to the management plan. Patient is aware to call the clinic if symptoms persist or worsen. Patient is aware when to return to the clinic for a follow-up visit. Patient educated on when it is appropriate to go to the emergency department.   Time call ended: 1312  I provided 8 minutes of face-to-face time during this encounter.    Gwenlyn Perking, FNP

## 2022-10-12 ENCOUNTER — Telehealth: Payer: Self-pay | Admitting: Family Medicine

## 2022-10-12 MED ORDER — NIRMATRELVIR/RITONAVIR (PAXLOVID)TABLET: 3.0000 | ORAL_TABLET | Freq: Two times a day (BID) | ORAL | 0 refills | Status: AC

## 2022-10-12 NOTE — Telephone Encounter (Signed)
Pt aware rx sent in. 

## 2022-10-12 NOTE — Telephone Encounter (Signed)
Sent COVID medicine to her pharmacy for her.

## 2022-11-02 DIAGNOSIS — Z419 Encounter for procedure for purposes other than remedying health state, unspecified: Secondary | ICD-10-CM | POA: Diagnosis not present

## 2022-11-04 ENCOUNTER — Other Ambulatory Visit (HOSPITAL_COMMUNITY)
Admission: RE | Admit: 2022-11-04 | Discharge: 2022-11-04 | Disposition: A | Discharge status: Home / Self Care | Payer: Medicaid Other | Source: Ambulatory Visit | Attending: Family Medicine | Admitting: Family Medicine

## 2022-11-04 ENCOUNTER — Ambulatory Visit: Payer: Medicaid Other | Admitting: Family Medicine

## 2022-11-04 ENCOUNTER — Encounter: Payer: Self-pay | Admitting: Family Medicine

## 2022-11-04 VITALS — BP 122/71 | HR 84 | Temp 98.3°F | Ht 64.0 in | Wt 228.0 lb

## 2022-11-04 DIAGNOSIS — F439 Reaction to severe stress, unspecified: Secondary | ICD-10-CM

## 2022-11-04 DIAGNOSIS — Z124 Encounter for screening for malignant neoplasm of cervix: Secondary | ICD-10-CM

## 2022-11-04 DIAGNOSIS — K760 Fatty (change of) liver, not elsewhere classified: Secondary | ICD-10-CM

## 2022-11-04 DIAGNOSIS — F329 Major depressive disorder, single episode, unspecified: Secondary | ICD-10-CM | POA: Diagnosis not present

## 2022-11-04 DIAGNOSIS — F900 Attention-deficit hyperactivity disorder, predominantly inattentive type: Secondary | ICD-10-CM | POA: Diagnosis not present

## 2022-11-04 DIAGNOSIS — R7303 Prediabetes: Secondary | ICD-10-CM

## 2022-11-04 DIAGNOSIS — Z0000 Encounter for general adult medical examination without abnormal findings: Secondary | ICD-10-CM

## 2022-11-04 DIAGNOSIS — Z0001 Encounter for general adult medical examination with abnormal findings: Secondary | ICD-10-CM

## 2022-11-04 DIAGNOSIS — E063 Autoimmune thyroiditis: Secondary | ICD-10-CM | POA: Diagnosis not present

## 2022-11-04 DIAGNOSIS — E038 Other specified hypothyroidism: Secondary | ICD-10-CM

## 2022-11-04 MED ORDER — AMPHETAMINE-DEXTROAMPHETAMINE 10 MG PO TABS: ORAL_TABLET | ORAL | 0 refills | Status: DC

## 2022-11-04 MED ORDER — LEVOTHYROXINE SODIUM 175 MCG PO TABS: ORAL_TABLET | ORAL | 3 refills | Status: DC

## 2022-11-04 MED ORDER — BUPROPION HCL ER (XL) 150 MG PO TB24: 150.0000 mg | ORAL_TABLET | Freq: Every day | ORAL | 3 refills | Status: DC

## 2022-11-04 NOTE — Progress Notes (Signed)
Ranae Kidwell is a 52 y.o. female presents to office today for annual physical exam examination.    Concerns today include: 1. None.  Might be losing insurance so wants to get physical/ pap/ preventative care done.  Wants fasting labs.  Needs refills on ADHD meds.  Intermittently forgets to take.  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, Marital status: single, Substance use: some vaping Diet: typical american but increasing fruits, Exercise: none Last eye exam: needs Last colonoscopy: needs Last mammogram: needs Last pap smear: needs Refills needed today: all Immunizations needed: Immunization History  Administered Date(s) Administered   DTaP 07/14/1971, 10/06/1971, 03/16/1972, 03/22/1975, 11/25/1994   Hepatitis B 06/10/2011, 07/15/2011, 10/06/2011   IPV 08/18/1971, 11/17/1971, 03/16/1972, 03/21/1974   Influenza Nasal 04/19/2013   Influenza,inj,Quad PF,6+ Mos 04/05/2019, 08/23/2020, 07/03/2022   Influenza-Unspecified 06/10/2011   MMR 02/02/1973, 08/21/1997   Moderna Sars-Covid-2 Vaccination 08/29/2019, 09/29/2019, 09/03/2020   Td 07/03/2022   Tdap 06/10/2011   Varicella 07/03/2011, 08/17/2011   Zoster Recombinat (Shingrix) 04/01/2022     Past Medical History:  Diagnosis Date   Allergy    Anxiety    Asthma    Blood transfusion without reported diagnosis    Depression    GERD (gastroesophageal reflux disease)    Thyroid disease    Social History   Socioeconomic History   Marital status: Single    Spouse name: Not on file   Number of children: Not on file   Years of education: Not on file   Highest education level: Not on file  Occupational History   Not on file  Tobacco Use   Smoking status: Every Day    Packs/day: 0.20    Years: 15.00    Additional pack years: 0.00    Total pack years: 3.00    Types: Cigarettes   Smokeless tobacco: Never  Vaping Use   Vaping Use: Never used  Substance and Sexual Activity   Alcohol use: Not  Currently    Comment: Rarely   Drug use: Never   Sexual activity: Yes    Birth control/protection: I.U.D.  Other Topics Concern   Not on file  Social History Narrative   Not on file   Social Determinants of Health   Financial Resource Strain: Not on file  Food Insecurity: Not on file  Transportation Needs: Not on file  Physical Activity: Not on file  Stress: Not on file  Social Connections: Not on file  Intimate Partner Violence: Not on file   Past Surgical History:  Procedure Laterality Date   CESAREAN SECTION     COLONOSCOPY  06/21/2018   Dr.Perry   EXTERNAL EAR SURGERY     TONSILECTOMY, ADENOIDECTOMY, BILATERAL MYRINGOTOMY AND TUBES     Family History  Problem Relation Age of Onset   Cancer Mother    COPD Mother    Pancreatic cancer Father    COPD Father    Cancer Maternal Grandmother    COPD Maternal Grandmother    Colon cancer Neg Hx    Esophageal cancer Neg Hx    Rectal cancer Neg Hx    Stomach cancer Neg Hx     Current Outpatient Medications:    b complex vitamins capsule, Take 1 capsule by mouth daily., Disp: , Rfl:    famotidine (PEPCID) 20 MG tablet, Take 20 mg by mouth daily., Disp: , Rfl:    fexofenadine (ALLEGRA) 180 MG tablet, Take 180 mg by mouth daily., Disp: , Rfl:  FISH OIL-KRILL OIL PO, Take by mouth., Disp: , Rfl:    gabapentin (NEURONTIN) 300 MG capsule, Take 300 mg by mouth 3 (three) times daily., Disp: , Rfl:    lidocaine (LIDODERM) 5 %, Place 1 patch onto the skin daily. Remove & Discard patch within 12 hours or as directed by MD, Disp: 5 patch, Rfl: 0   methocarbamol (ROBAXIN) 500 MG tablet, Take 1 tablet (500 mg total) by mouth every 8 (eight) hours as needed for muscle spasms., Disp: 30 tablet, Rfl: 0   Multiple Vitamin (MULTIVITAMIN) tablet, Take 1 tablet by mouth daily., Disp: , Rfl:    nystatin (MYCOSTATIN) 100000 UNIT/ML suspension, Take 5 mLs (500,000 Units total) by mouth 4 (four) times daily., Disp: 240 mL, Rfl: 0   OVER THE  COUNTER MEDICATION, Vitamin D 3 2000 IU one capsule daily., Disp: , Rfl:    sucralfate (CARAFATE) 1 g tablet, TAKE ONE TABLET BY MOUTH THREE TIMES DAILY AS NEEDED., Disp: 42 tablet, Rfl: 0   [START ON 12/04/2022] amphetamine-dextroamphetamine (ADDERALL) 10 MG tablet, Take 20mg  each morning and 10mg  each afternoon, Disp: 90 tablet, Rfl: 0   [START ON 01/03/2023] amphetamine-dextroamphetamine (ADDERALL) 10 MG tablet, Take 20mg  each morning and 10mg  each afternoon, Disp: 90 tablet, Rfl: 0   amphetamine-dextroamphetamine (ADDERALL) 10 MG tablet, Take 20mg  each morning and 10mg  each afternoon, Disp: 90 tablet, Rfl: 0   buPROPion (WELLBUTRIN XL) 150 MG 24 hr tablet, Take 1 tablet (150 mg total) by mouth daily. Dose reduction, Disp: 90 tablet, Rfl: 3   levothyroxine (SYNTHROID) 175 MCG tablet, TAKE ONE TABLET BY MOUTH DAILY BEFORE BREAKFAST, Disp: 90 tablet, Rfl: 3  No Known Allergies   ROS: Review of Systems Pertinent items noted in HPI and remainder of comprehensive ROS otherwise negative.    Physical exam BP 122/71   Pulse 84   Temp 98.3 F (36.8 C)   Ht 5\' 4"  (1.626 m)   Wt 228 lb (103.4 kg)   SpO2 96%   BMI 39.14 kg/m  General appearance: alert, cooperative, appears stated age, no distress, and morbidly obese Head: Normocephalic, without obvious abnormality, atraumatic Eyes: negative findings: lids and lashes normal, conjunctivae and sclerae normal, corneas clear, and pupils equal, round, reactive to light and accomodation Ears: normal TM's and external ear canals both ears Nose: Nares normal. Septum midline. Mucosa normal. No drainage or sinus tenderness. Throat: lips, mucosa, and tongue normal; teeth and gums normal Neck: no adenopathy, supple, symmetrical, trachea midline, and thyroid not enlarged, symmetric, no tenderness/mass/nodules Back: symmetric, no curvature. ROM normal. No CVA tenderness. Lungs: clear to auscultation bilaterally Heart: regular rate and rhythm, S1, S2 normal, no  murmur, click, rub or gallop Abdomen: soft, non-tender; bowel sounds normal; no masses,  no organomegaly Pelvic: cervix normal in appearance, external genitalia normal, no adnexal masses or tenderness, no cervical motion tenderness, rectovaginal septum normal, uterus normal size, shape, and consistency, vagina normal without discharge, and IUD strings visualized Extremities: extremities normal, atraumatic, no cyanosis or edema Pulses: 2+ and symmetric Skin: Skin color, texture, turgor normal. No rashes or lesions Lymph nodes: Cervical, supraclavicular, and axillary nodes normal. Neurologic: Grossly normal Psych: mood stable, speech normal, affect appropriate     11/04/2022    2:27 PM 08/04/2022    4:17 PM 07/03/2022    9:52 AM  Depression screen PHQ 2/9  Decreased Interest 1 0 0  Down, Depressed, Hopeless 1 1 1   PHQ - 2 Score 2 1 1   Altered sleeping 1 0 0  Tired, decreased energy 2 1 1   Change in appetite 1 2 1   Feeling bad or failure about yourself  1 2 0  Trouble concentrating 0 0 0  Moving slowly or fidgety/restless 0 0 0  Suicidal thoughts 0 0 0  PHQ-9 Score 7 6 3   Difficult doing work/chores Somewhat difficult  Not difficult at all      11/04/2022    2:22 PM 08/04/2022    4:18 PM 07/03/2022    9:52 AM 04/01/2022    1:34 PM  GAD 7 : Generalized Anxiety Score  Nervous, Anxious, on Edge 1 0 0 2  Control/stop worrying 1 3 0 2  Worry too much - different things 1 3 0 3  Trouble relaxing 1 3 1 3   Restless 0 0 0 0  Easily annoyed or irritable 1 1 1 1   Afraid - awful might happen 1 3 1 3   Total GAD 7 Score 6 13 3 14   Anxiety Difficulty Somewhat difficult Somewhat difficult Somewhat difficult Somewhat difficult    Assessment/ Plan: Julie Alvarado here for annual physical exam.   Annual physical exam  Screening for malignant neoplasm of cervix - Plan: Cytology - PAP  Attention deficit hyperactivity disorder (ADHD), predominantly inattentive type - Plan:  amphetamine-dextroamphetamine (ADDERALL) 10 MG tablet, amphetamine-dextroamphetamine (ADDERALL) 10 MG tablet, amphetamine-dextroamphetamine (ADDERALL) 10 MG tablet  Hypothyroidism due to Hashimoto's thyroiditis - Plan: levothyroxine (SYNTHROID) 175 MCG tablet, TSH, T4, free, CANCELED: TSH, CANCELED: T4, Free  Situational stress - Plan: buPROPion (WELLBUTRIN XL) 150 MG 24 hr tablet  Reactive depression - Plan: buPROPion (WELLBUTRIN XL) 150 MG 24 hr tablet  Morbid obesity - Plan: Bayer DCA Hb A1c Waived, Lipid panel, CBC, VITAMIN D 25 Hydroxy (Vit-D Deficiency, Fractures), CMP14+EGFR  Fatty liver - Plan: Lipid panel, CBC, CMP14+EGFR  Pre-diabetes - Plan: Bayer DCA Hb A1c Waived, CMP14+EGFR  Schedule mammogram, colonoscopy.  Screening pap smear collected  ADHD stable.  UDS and CSC UTD.  [place] national narcotic database reviewed. No red flags.  Meds renewed  She will come in for fasting labs Monday.  Appt scheduled.  Meds have been renewed for mood.  Situation getting somewhat better with dad and financials thankfully.  Reinforced lifestyle modification due to preDM, FLiver on CT and obesity.  Offered referral to nutrition.  Counseled on healthy lifestyle choices, including diet (rich in fruits, vegetables and lean meats and low in salt and simple carbohydrates) and exercise (at least 30 minutes of moderate physical activity daily).  Patient to follow up in 4m  Spencer Peterkin M. Lajuana Ripple, DO

## 2022-11-04 NOTE — Patient Instructions (Signed)
Fatty Liver Disease  The liver converts food into energy, removes toxic material from the blood, makes important proteins, and absorbs necessary vitamins from food. Fatty liver disease occurs when too much fat has built up in your liver cells. Fatty liver disease is also called hepatic steatosis. In many cases, fatty liver disease does not cause symptoms or problems. It is often diagnosed when tests are being done for other reasons. However, over time, fatty liver can cause inflammation that may lead to more serious liver problems, such as scarring of the liver (cirrhosis) and liver failure. Fatty liver is associated with insulin resistance, increased body fat, high blood pressure (hypertension), and high cholesterol. These are features of metabolic syndrome and increase your risk for stroke, diabetes, and heart disease. What are the causes? This condition may be caused by components of metabolic syndrome: Obesity. Insulin resistance. High cholesterol. Other causes: Alcohol abuse. Poor nutrition. Cushing syndrome. Pregnancy. Certain drugs. Poisons. Some viral infections. What increases the risk? You are more likely to develop this condition if you: Abuse alcohol. Are overweight. Have diabetes. Have hepatitis. Have a high triglyceride level. Are pregnant. What are the signs or symptoms? Fatty liver disease often does not cause symptoms. If symptoms do develop, they can include: Fatigue and weakness. Weight loss. Confusion. Nausea, vomiting, or abdominal pain. Yellowing of your skin and the white parts of your eyes (jaundice). Itchy skin. How is this diagnosed? This condition may be diagnosed by: A physical exam and your medical history. Blood tests. Imaging tests, such as an ultrasound, CT scan, or MRI. A liver biopsy. A small sample of liver tissue is removed using a needle. The sample is then looked at under a microscope. How is this treated? Fatty liver disease is often  caused by other health conditions. Treatment for fatty liver may involve medicines and lifestyle changes to manage conditions such as: Alcoholism. High cholesterol. Diabetes. Being overweight or obese. Follow these instructions at home:  Do not drink alcohol. If you have trouble quitting, ask your health care provider how to safely quit with the help of medicine or a supervised program. This is important to keep your condition from getting worse. Eat a healthy diet as told by your health care provider. Ask your health care provider about working with a dietitian to develop an eating plan. Exercise regularly. This can help you lose weight and control your cholesterol and diabetes. Talk to your health care provider about an exercise plan and which activities are best for you. Take over-the-counter and prescription medicines only as told by your health care provider. Keep all follow-up visits. This is important. Contact a health care provider if: You have trouble controlling your: Blood sugar. This is especially important if you have diabetes. Cholesterol. Drinking of alcohol. Get help right away if: You have abdominal pain. You have jaundice. You have nausea and are vomiting. You vomit blood or material that looks like coffee grounds. You have stools that are black, tar-like, or bloody. Summary Fatty liver disease develops when too much fat builds up in the cells of your liver. Fatty liver disease often causes no symptoms or problems. However, over time, fatty liver can cause inflammation that may lead to more serious liver problems, such as scarring of the liver (cirrhosis). You are more likely to develop this condition if you abuse alcohol, are pregnant, are overweight, have diabetes, have hepatitis, or have high triglyceride or cholesterol levels. Contact your health care provider if you have trouble controlling your blood   sugar, cholesterol, or drinking of alcohol. This information is  not intended to replace advice given to you by your health care provider. Make sure you discuss any questions you have with your health care provider. Document Revised: 05/02/2020 Document Reviewed: 05/02/2020 Elsevier Patient Education  2023 Elsevier Inc.  

## 2022-11-05 ENCOUNTER — Other Ambulatory Visit: Payer: Self-pay | Admitting: Family Medicine

## 2022-11-05 DIAGNOSIS — Z1231 Encounter for screening mammogram for malignant neoplasm of breast: Secondary | ICD-10-CM

## 2022-11-06 LAB — CYTOLOGY - PAP
Adequacy: ABSENT
Comment: NEGATIVE
Diagnosis: NEGATIVE
High risk HPV: NEGATIVE

## 2022-11-09 ENCOUNTER — Ambulatory Visit
Admission: RE | Admit: 2022-11-09 | Discharge: 2022-11-09 | Disposition: A | Discharge status: Home / Self Care | Payer: Medicaid Other | Source: Ambulatory Visit | Attending: Family Medicine | Admitting: Family Medicine

## 2022-11-09 ENCOUNTER — Other Ambulatory Visit: Payer: Medicaid Other

## 2022-11-09 DIAGNOSIS — Z1231 Encounter for screening mammogram for malignant neoplasm of breast: Secondary | ICD-10-CM | POA: Diagnosis not present

## 2022-11-10 NOTE — Progress Notes (Signed)
[facility] will contact patient to schedule appt

## 2022-11-11 ENCOUNTER — Other Ambulatory Visit: Payer: Medicaid Other

## 2022-11-11 ENCOUNTER — Other Ambulatory Visit: Payer: Self-pay | Admitting: Family Medicine

## 2022-11-11 DIAGNOSIS — R928 Other abnormal and inconclusive findings on diagnostic imaging of breast: Secondary | ICD-10-CM

## 2022-11-11 DIAGNOSIS — E038 Other specified hypothyroidism: Secondary | ICD-10-CM | POA: Diagnosis not present

## 2022-11-11 DIAGNOSIS — R7303 Prediabetes: Secondary | ICD-10-CM | POA: Diagnosis not present

## 2022-11-11 DIAGNOSIS — E063 Autoimmune thyroiditis: Secondary | ICD-10-CM | POA: Diagnosis not present

## 2022-11-11 DIAGNOSIS — K760 Fatty (change of) liver, not elsewhere classified: Secondary | ICD-10-CM

## 2022-11-11 LAB — BAYER DCA HB A1C WAIVED: HB A1C (BAYER DCA - WAIVED): 5.7 % — ABNORMAL HIGH (ref 4.8–5.6)

## 2022-11-12 LAB — CMP14+EGFR
ALT: 28 IU/L (ref 0–32)
AST: 26 IU/L (ref 0–40)
Albumin/Globulin Ratio: 1.3 (ref 1.2–2.2)
Albumin: 4 g/dL (ref 3.8–4.9)
Alkaline Phosphatase: 51 IU/L (ref 44–121)
BUN/Creatinine Ratio: 10 (ref 9–23)
BUN: 9 mg/dL (ref 6–24)
Bilirubin Total: 0.4 mg/dL (ref 0.0–1.2)
CO2: 22 mmol/L (ref 20–29)
Calcium: 9.2 mg/dL (ref 8.7–10.2)
Chloride: 101 mmol/L (ref 96–106)
Creatinine, Ser: 0.94 mg/dL (ref 0.57–1.00)
Globulin, Total: 3 g/dL (ref 1.5–4.5)
Glucose: 135 mg/dL — ABNORMAL HIGH (ref 70–99)
Potassium: 4.3 mmol/L (ref 3.5–5.2)
Sodium: 138 mmol/L (ref 134–144)
Total Protein: 7 g/dL (ref 6.0–8.5)
eGFR: 73 mL/min/{1.73_m2} (ref >59)

## 2022-11-12 LAB — LIPID PANEL
Chol/HDL Ratio: 3.9 ratio (ref 0.0–4.4)
Cholesterol, Total: 150 mg/dL (ref 100–199)
HDL: 38 mg/dL — ABNORMAL LOW (ref >39)
LDL Chol Calc (NIH): 92 mg/dL (ref 0–99)
Triglycerides: 107 mg/dL (ref 0–149)
VLDL Cholesterol Cal: 20 mg/dL (ref 5–40)

## 2022-11-12 LAB — CBC
Hematocrit: 42.6 % (ref 34.0–46.6)
Hemoglobin: 14.4 g/dL (ref 11.1–15.9)
MCH: 32 pg (ref 26.6–33.0)
MCHC: 33.8 g/dL (ref 31.5–35.7)
MCV: 95 fL (ref 79–97)
Platelets: 280 10*3/uL (ref 150–450)
RBC: 4.5 x10E6/uL (ref 3.77–5.28)
RDW: 12.4 % (ref 11.7–15.4)
WBC: 7.3 10*3/uL (ref 3.4–10.8)

## 2022-11-12 LAB — TSH: TSH: 3.38 u[IU]/mL (ref 0.450–4.500)

## 2022-11-12 LAB — VITAMIN D 25 HYDROXY (VIT D DEFICIENCY, FRACTURES): Vit D, 25-Hydroxy: 45.4 ng/mL (ref 30.0–100.0)

## 2022-11-12 LAB — T4, FREE: Free T4: 1.56 ng/dL (ref 0.82–1.77)

## 2022-11-17 NOTE — Progress Notes (Signed)
Appt 11/24/22 for add imaging

## 2022-11-24 ENCOUNTER — Other Ambulatory Visit: Payer: Medicaid Other

## 2022-12-02 DIAGNOSIS — Z419 Encounter for procedure for purposes other than remedying health state, unspecified: Secondary | ICD-10-CM | POA: Diagnosis not present

## 2022-12-07 ENCOUNTER — Ambulatory Visit
Admission: RE | Admit: 2022-12-07 | Discharge: 2022-12-07 | Disposition: A | Discharge status: Home / Self Care | Payer: Medicaid Other | Source: Ambulatory Visit | Attending: Family Medicine | Admitting: Family Medicine

## 2022-12-07 DIAGNOSIS — R922 Inconclusive mammogram: Secondary | ICD-10-CM | POA: Diagnosis not present

## 2022-12-07 DIAGNOSIS — R928 Other abnormal and inconclusive findings on diagnostic imaging of breast: Secondary | ICD-10-CM

## 2022-12-16 ENCOUNTER — Telehealth (INDEPENDENT_AMBULATORY_CARE_PROVIDER_SITE_OTHER): Payer: Medicaid Other | Admitting: Family Medicine

## 2022-12-16 DIAGNOSIS — J0100 Acute maxillary sinusitis, unspecified: Secondary | ICD-10-CM | POA: Diagnosis not present

## 2022-12-16 MED ORDER — PSEUDOEPHEDRINE-GUAIFENESIN ER 120-1200 MG PO TB12
1.0000 | ORAL_TABLET | Freq: Two times a day (BID) | ORAL | 0 refills | Status: DC
Start: 2022-12-16 — End: 2023-09-22

## 2022-12-16 MED ORDER — SULFAMETHOXAZOLE-TRIMETHOPRIM 800-160 MG PO TABS
1.0000 | ORAL_TABLET | Freq: Two times a day (BID) | ORAL | 0 refills | Status: DC
Start: 2022-12-16 — End: 2023-05-14

## 2022-12-16 NOTE — Progress Notes (Signed)
Subjective:    Patient ID: Julie Alvarado, female    DOB: [birth year], 52 y.o.   MRN: [id number]   HPI: Julie Alvarado is a 52 y.o. female presenting for Symptoms include congestion, facial pain, nasal congestion, non productive cough, post nasal drip and sinus pressure and heavinesss behind her eyes, just to the right of the nose and dull pain over the right ear.. There is no fever, chills, or sweats. Onset of symptoms was a few days ago, gradually worsening since that time.        11/04/2022    2:27 PM 08/04/2022    4:17 PM 07/03/2022    9:52 AM 04/01/2022    1:33 PM 02/10/2022   10:06 AM  Depression screen PHQ 2/9  Decreased Interest 1 0 0 1 2  Down, Depressed, Hopeless 1 1 1 1 2   PHQ - 2 Score 2 1 1 2 4   Altered sleeping 1 0 0 1 3  Tired, decreased energy 2 1 1 1 3   Change in appetite 1 2 1 2 3   Feeling bad or failure about yourself  1 2 0 2 1  Trouble concentrating 0 0 0 1 0  Moving slowly or fidgety/restless 0 0 0 0 0  Suicidal thoughts 0 0 0 0 0  PHQ-9 Score 7 6 3 9 14   Difficult doing work/chores Somewhat difficult  Not difficult at all Somewhat difficult Somewhat difficult     Relevant past medical, surgical, family and social history reviewed and updated as indicated.  Interim medical history since our last visit reviewed. Allergies and medications reviewed and updated.  ROS:  Review of Systems  Constitutional:  Negative for activity change, appetite change, chills and fever.  HENT:  Positive for congestion, postnasal drip, rhinorrhea and sinus pressure. Negative for ear discharge, ear pain, hearing loss, nosebleeds, sneezing and trouble swallowing.   Respiratory:  Negative for chest tightness and shortness of breath.   Cardiovascular:  Negative for chest pain and palpitations.  Skin:  Negative for rash.     Social History   Tobacco Use  Smoking Status Every Day   Packs/day: 0.20   Years: 15.00   Additional pack years: 0.00   Total pack years: 3.00   Types:  Cigarettes  Smokeless Tobacco Never       Objective:     Wt Readings from Last 3 Encounters:  11/04/22 228 lb (103.4 kg)  08/04/22 227 lb 6.4 oz (103.1 kg)  07/03/22 228 lb 12.8 oz (103.8 kg)     Video visit performed.   Assessment & Plan:   1. Acute maxillary sinusitis, recurrence not specified     Meds ordered this encounter  Medications   sulfamethoxazole-trimethoprim (BACTRIM DS) 800-160 MG tablet    Sig: Take 1 tablet by mouth 2 (two) times daily. Until gone, for infection    Dispense:  20 tablet    Refill:  0   Pseudoephedrine-Guaifenesin [phone] MG TB12    Sig: Take 1 tablet by mouth 2 (two) times daily. For congestion    Dispense:  16 tablet    Refill:  0        Diagnoses and all orders for this visit:  Acute maxillary sinusitis, recurrence not specified  Other orders -     sulfamethoxazole-trimethoprim (BACTRIM DS) 800-160 MG tablet; Take 1 tablet by mouth 2 (two) times daily. Until gone, for infection -     Pseudoephedrine-Guaifenesin [phone] MG TB12; Take 1 tablet by mouth 2 (two) times daily.  For congestion    Virtual  Note  I discussed the limitations, risks, security and privacy concerns of performing an evaluation and management service by video and the availability of in person appointments. The patient was identified with two identifiers. Pt.expressed understanding and agreed to proceed. Pt. Is at home. Dr. Darlyn Read is in his office.  Follow Up Instructions:   I discussed the assessment and treatment plan with the patient. The patient was provided an opportunity to ask questions and all were answered. The patient agreed with the plan and demonstrated an understanding of the instructions.   The patient was advised to call back or seek an in-person evaluation if the symptoms worsen or if the condition fails to improve as anticipated.   Total minutes including chart review and phone contact time: 14   Follow up plan: Return if symptoms worsen  or fail to improve.  Mechele Claude, MD [facility]

## 2022-12-22 ENCOUNTER — Encounter: Payer: Self-pay | Admitting: Family Medicine

## 2023-01-02 DIAGNOSIS — Z419 Encounter for procedure for purposes other than remedying health state, unspecified: Secondary | ICD-10-CM | POA: Diagnosis not present

## 2023-01-04 DIAGNOSIS — M47812 Spondylosis without myelopathy or radiculopathy, cervical region: Secondary | ICD-10-CM | POA: Diagnosis not present

## 2023-01-04 DIAGNOSIS — R2689 Other abnormalities of gait and mobility: Secondary | ICD-10-CM | POA: Diagnosis not present

## 2023-01-04 DIAGNOSIS — M436 Torticollis: Secondary | ICD-10-CM | POA: Diagnosis not present

## 2023-01-04 DIAGNOSIS — Z7409 Other reduced mobility: Secondary | ICD-10-CM | POA: Diagnosis not present

## 2023-01-04 DIAGNOSIS — R293 Abnormal posture: Secondary | ICD-10-CM | POA: Diagnosis not present

## 2023-01-08 ENCOUNTER — Encounter: Payer: Self-pay | Admitting: Family Medicine

## 2023-01-08 DIAGNOSIS — F900 Attention-deficit hyperactivity disorder, predominantly inattentive type: Secondary | ICD-10-CM

## 2023-01-08 MED ORDER — AMPHETAMINE-DEXTROAMPHETAMINE 10 MG PO TABS
ORAL_TABLET | ORAL | 0 refills | Status: DC
Start: 2023-01-08 — End: 2023-02-12

## 2023-01-11 DIAGNOSIS — R293 Abnormal posture: Secondary | ICD-10-CM | POA: Diagnosis not present

## 2023-01-11 DIAGNOSIS — M436 Torticollis: Secondary | ICD-10-CM | POA: Diagnosis not present

## 2023-01-11 DIAGNOSIS — M47812 Spondylosis without myelopathy or radiculopathy, cervical region: Secondary | ICD-10-CM | POA: Diagnosis not present

## 2023-01-11 DIAGNOSIS — R2689 Other abnormalities of gait and mobility: Secondary | ICD-10-CM | POA: Diagnosis not present

## 2023-01-11 DIAGNOSIS — Z7409 Other reduced mobility: Secondary | ICD-10-CM | POA: Diagnosis not present

## 2023-01-14 DIAGNOSIS — R293 Abnormal posture: Secondary | ICD-10-CM | POA: Diagnosis not present

## 2023-01-14 DIAGNOSIS — M436 Torticollis: Secondary | ICD-10-CM | POA: Diagnosis not present

## 2023-01-14 DIAGNOSIS — R2689 Other abnormalities of gait and mobility: Secondary | ICD-10-CM | POA: Diagnosis not present

## 2023-01-14 DIAGNOSIS — M47812 Spondylosis without myelopathy or radiculopathy, cervical region: Secondary | ICD-10-CM | POA: Diagnosis not present

## 2023-01-14 DIAGNOSIS — Z7409 Other reduced mobility: Secondary | ICD-10-CM | POA: Diagnosis not present

## 2023-01-19 ENCOUNTER — Ambulatory Visit: Payer: Self-pay | Admitting: *Deleted

## 2023-01-21 DIAGNOSIS — M436 Torticollis: Secondary | ICD-10-CM | POA: Diagnosis not present

## 2023-01-21 DIAGNOSIS — R2689 Other abnormalities of gait and mobility: Secondary | ICD-10-CM | POA: Diagnosis not present

## 2023-01-21 DIAGNOSIS — M47812 Spondylosis without myelopathy or radiculopathy, cervical region: Secondary | ICD-10-CM | POA: Diagnosis not present

## 2023-01-21 DIAGNOSIS — R293 Abnormal posture: Secondary | ICD-10-CM | POA: Diagnosis not present

## 2023-01-21 DIAGNOSIS — Z7409 Other reduced mobility: Secondary | ICD-10-CM | POA: Diagnosis not present

## 2023-01-22 DIAGNOSIS — W57XXXA Bitten or stung by nonvenomous insect and other nonvenomous arthropods, initial encounter: Secondary | ICD-10-CM | POA: Diagnosis not present

## 2023-01-22 DIAGNOSIS — R030 Elevated blood-pressure reading, without diagnosis of hypertension: Secondary | ICD-10-CM | POA: Diagnosis not present

## 2023-01-22 DIAGNOSIS — Z6841 Body Mass Index (BMI) 40.0 and over, adult: Secondary | ICD-10-CM | POA: Diagnosis not present

## 2023-01-22 DIAGNOSIS — S70362A Insect bite (nonvenomous), left thigh, initial encounter: Secondary | ICD-10-CM | POA: Diagnosis not present

## 2023-02-01 DIAGNOSIS — Z419 Encounter for procedure for purposes other than remedying health state, unspecified: Secondary | ICD-10-CM | POA: Diagnosis not present

## 2023-02-10 ENCOUNTER — Encounter: Payer: Self-pay | Admitting: Family Medicine

## 2023-02-12 ENCOUNTER — Ambulatory Visit: Payer: Medicaid Other | Admitting: Family Medicine

## 2023-02-12 ENCOUNTER — Encounter: Payer: Self-pay | Admitting: Family Medicine

## 2023-02-12 VITALS — BP 123/76 | HR 85 | Temp 97.6°F | Ht 64.0 in | Wt 234.0 lb

## 2023-02-12 DIAGNOSIS — Z636 Dependent relative needing care at home: Secondary | ICD-10-CM

## 2023-02-12 DIAGNOSIS — E038 Other specified hypothyroidism: Secondary | ICD-10-CM

## 2023-02-12 DIAGNOSIS — F900 Attention-deficit hyperactivity disorder, predominantly inattentive type: Secondary | ICD-10-CM

## 2023-02-12 DIAGNOSIS — F329 Major depressive disorder, single episode, unspecified: Secondary | ICD-10-CM | POA: Diagnosis not present

## 2023-02-12 MED ORDER — AMPHETAMINE-DEXTROAMPHETAMINE 10 MG PO TABS
ORAL_TABLET | ORAL | 0 refills | Status: DC
Start: 2023-04-13 — End: 2023-09-22

## 2023-02-12 MED ORDER — FLUOXETINE HCL 20 MG PO TABS
20.0000 mg | ORAL_TABLET | Freq: Every day | ORAL | 3 refills | Status: DC
Start: 2023-02-12 — End: 2023-06-29

## 2023-02-12 MED ORDER — AMPHETAMINE-DEXTROAMPHETAMINE 10 MG PO TABS
ORAL_TABLET | ORAL | 0 refills | Status: DC
Start: 2023-03-14 — End: 2023-09-22

## 2023-02-12 MED ORDER — AMPHETAMINE-DEXTROAMPHETAMINE 10 MG PO TABS: ORAL_TABLET | ORAL | 0 refills | Status: DC

## 2023-02-12 NOTE — Progress Notes (Unsigned)
Subjective: CC:*** PCP: Raliegh Ip, DO WUX:LKGM Julie Alvarado is a 52 y.o. female presenting to clinic today for:  1. ***   ROS: Per HPI  No Known Allergies Past Medical History:  Diagnosis Date   Allergy    Anxiety    Asthma    Blood transfusion without reported diagnosis    Depression    GERD (gastroesophageal reflux disease)    Thyroid disease     Current Outpatient Medications:    amphetamine-dextroamphetamine (ADDERALL) 10 MG tablet, Take 20mg  each morning and 10mg  each afternoon, Disp: 90 tablet, Rfl: 0   amphetamine-dextroamphetamine (ADDERALL) 10 MG tablet, Take 20mg  each morning and 10mg  each afternoon, Disp: 90 tablet, Rfl: 0   amphetamine-dextroamphetamine (ADDERALL) 10 MG tablet, Take 20mg  each morning and 10mg  each afternoon, Disp: 90 tablet, Rfl: 0   b complex vitamins capsule, Take 1 capsule by mouth daily., Disp: , Rfl:    buPROPion (WELLBUTRIN XL) 150 MG 24 hr tablet, Take 1 tablet (150 mg total) by mouth daily. Dose reduction, Disp: 90 tablet, Rfl: 3   famotidine (PEPCID) 20 MG tablet, Take 20 mg by mouth daily., Disp: , Rfl:    fexofenadine (ALLEGRA) 180 MG tablet, Take 180 mg by mouth daily., Disp: , Rfl:    FISH OIL-KRILL OIL PO, Take by mouth., Disp: , Rfl:    gabapentin (NEURONTIN) 300 MG capsule, Take 300 mg by mouth 3 (three) times daily., Disp: , Rfl:    levothyroxine (SYNTHROID) 175 MCG tablet, TAKE ONE TABLET BY MOUTH DAILY BEFORE BREAKFAST, Disp: 90 tablet, Rfl: 3   lidocaine (LIDODERM) 5 %, Place 1 patch onto the skin daily. Remove & Discard patch within 12 hours or as directed by MD, Disp: 5 patch, Rfl: 0   methocarbamol (ROBAXIN) 500 MG tablet, Take 1 tablet (500 mg total) by mouth every 8 (eight) hours as needed for muscle spasms., Disp: 30 tablet, Rfl: 0   Multiple Vitamin (MULTIVITAMIN) tablet, Take 1 tablet by mouth daily., Disp: , Rfl:    nystatin (MYCOSTATIN) 100000 UNIT/ML suspension, Take 5 mLs (500,000 Units total) by mouth 4 (four)  times daily., Disp: 240 mL, Rfl: 0   OVER THE COUNTER MEDICATION, Vitamin D 3 2000 IU one capsule daily., Disp: , Rfl:    Pseudoephedrine-Guaifenesin [phone] MG TB12, Take 1 tablet by mouth 2 (two) times daily. For congestion, Disp: 16 tablet, Rfl: 0   sucralfate (CARAFATE) 1 g tablet, TAKE ONE TABLET BY MOUTH THREE TIMES DAILY AS NEEDED., Disp: 42 tablet, Rfl: 0   sulfamethoxazole-trimethoprim (BACTRIM DS) 800-160 MG tablet, Take 1 tablet by mouth 2 (two) times daily. Until gone, for infection, Disp: 20 tablet, Rfl: 0 Social History   Socioeconomic History   Marital status: Single    Spouse name: Not on file   Number of children: Not on file   Years of education: Not on file   Highest education level: Not on file  Occupational History   Not on file  Tobacco Use   Smoking status: Every Day    Current packs/day: 0.20    Average packs/day: 0.2 packs/day for 15.0 years (3.0 ttl pk-yrs)    Types: Cigarettes   Smokeless tobacco: Never  Vaping Use   Vaping status: Never Used  Substance and Sexual Activity   Alcohol use: Not Currently    Comment: Rarely   Drug use: Never   Sexual activity: Yes    Birth control/protection: I.U.D.  Other Topics Concern   Not on file  Social History  Narrative   Not on file   Social Determinants of Health   Financial Resource Strain: Not on file  Food Insecurity: No Food Insecurity (06/10/2022)   Received from [facility]   Hunger Vital Sign    Worried About Running Out of Food in the Last Year: Never true    Ran Out of Food in the Last Year: Never true  Transportation Needs: Not on file  Physical Activity: Not on file  Stress: Not on file  Social Connections: Unknown (12/16/2021)   [facility]   Social Network    Social Network: Not on file  Intimate Partner Violence: Unknown (11/07/2021)   Received from [facility]   HITS    Physically Hurt: Not on file    Insult or Talk Down To: Not on file    Threaten Physical Harm: Not  on file    Scream or Curse: Not on file   Family History  Problem Relation Age of Onset   Breast cancer Mother    Cancer Mother    COPD Mother    Pancreatic cancer Father    COPD Father    Cancer Maternal Grandmother    COPD Maternal Grandmother    Colon cancer Neg Hx    Esophageal cancer Neg Hx    Rectal cancer Neg Hx    Stomach cancer Neg Hx     Objective: Office vital signs reviewed. BP 123/76   Pulse 85   Temp 97.6 F (36.4 C) (Temporal)   Ht 5\' 4"  (1.626 m)   Wt 234 lb (106.1 kg)   SpO2 95%   BMI 40.17 kg/m   Physical Examination:  General: Awake, alert, *** nourished, No acute distress HEENT: Normal    Neck: No masses palpated. No lymphadenopathy    Ears: Tympanic membranes intact, normal light reflex, no erythema, no bulging    Eyes: PERRLA, extraocular membranes intact, sclera ***    Nose: nasal turbinates moist, *** nasal discharge    Throat: moist mucus membranes, no erythema, *** tonsillar exudate.  Airway is patent Cardio: regular rate and rhythm, S1S2 heard, no murmurs appreciated Pulm: clear to auscultation bilaterally, no wheezes, rhonchi or rales; normal work of breathing on room air GI: soft, non-tender, non-distended, bowel sounds present x4, no hepatomegaly, no splenomegaly, no masses GU: external vaginal tissue ***, cervix ***, *** punctate lesions on cervix appreciated, *** discharge from cervical os, *** bleeding, *** cervical motion tenderness, *** abdominal/ adnexal masses Extremities: warm, well perfused, No edema, cyanosis or clubbing; +*** pulses bilaterally MSK: *** gait and *** station Skin: dry; intact; no rashes or lesions Neuro: *** Strength and light touch sensation grossly intact, *** DTRs ***/4  Assessment/ Plan: 52 y.o. female   ***  No orders of the defined types were placed in this encounter.  No orders of the defined types were placed in this encounter.    Raliegh Ip, DO [facility] [phone]

## 2023-02-20 ENCOUNTER — Other Ambulatory Visit (HOSPITAL_COMMUNITY): Payer: Self-pay

## 2023-03-04 DIAGNOSIS — Z419 Encounter for procedure for purposes other than remedying health state, unspecified: Secondary | ICD-10-CM | POA: Diagnosis not present

## 2023-04-04 DIAGNOSIS — Z419 Encounter for procedure for purposes other than remedying health state, unspecified: Secondary | ICD-10-CM | POA: Diagnosis not present

## 2023-04-29 ENCOUNTER — Ambulatory Visit
Admission: EM | Admit: 2023-04-29 | Discharge: 2023-04-29 | Disposition: A | Discharge status: Home / Self Care | Payer: Medicaid Other | Attending: Family Medicine | Admitting: Family Medicine

## 2023-04-29 DIAGNOSIS — R52 Pain, unspecified: Secondary | ICD-10-CM | POA: Diagnosis not present

## 2023-04-29 DIAGNOSIS — R5383 Other fatigue: Secondary | ICD-10-CM | POA: Diagnosis not present

## 2023-04-29 DIAGNOSIS — R6883 Chills (without fever): Secondary | ICD-10-CM | POA: Diagnosis not present

## 2023-04-29 DIAGNOSIS — J3089 Other allergic rhinitis: Secondary | ICD-10-CM | POA: Diagnosis not present

## 2023-04-29 LAB — POCT INFLUENZA A/B
Influenza A, POC: NEGATIVE
Influenza B, POC: NEGATIVE

## 2023-04-29 NOTE — ED Provider Notes (Signed)
[facility]    CSN: [id number] Arrival date & time: 04/29/23  1131      History   Chief Complaint Chief Complaint  Patient presents with   Nausea    HPI Julie Alvarado is a 52 y.o. female.   Presenting today with several week history of fatigue, body aches, headache, chills, sweats, nausea, runny nose, mild cough.  Denies sore throat, dizziness, chest pain, shortness of breath, palpitations.  Son has similar symptoms and tested positive for COVID 2 weeks ago, she states her initial COVID test was negative and she did not retest.  Taking Alka-Seltzer cold and sinus here and there, ibuprofen and Tylenol.    Past Medical History:  Diagnosis Date   Allergy    Anxiety    Asthma    Blood transfusion without reported diagnosis    Depression    GERD (gastroesophageal reflux disease)    Thyroid disease     Patient Active Problem List   Diagnosis Date Noted   Non-seasonal allergic rhinitis 08/08/2019   Gastroesophageal reflux disease 12/05/2018   Hypothyroidism due to Hashimoto's thyroiditis 08/29/2018   Attention deficit hyperactivity disorder (ADHD) 08/29/2018   Tobacco use disorder 08/29/2018    Past Surgical History:  Procedure Laterality Date   CESAREAN SECTION     COLONOSCOPY  06/21/2018   Dr.Perry   EXTERNAL EAR SURGERY     TONSILECTOMY, ADENOIDECTOMY, BILATERAL MYRINGOTOMY AND TUBES      OB History   No obstetric history on file.      Home Medications    Prior to Admission medications   Medication Sig Start Date End Date Taking? Authorizing Provider  amphetamine-dextroamphetamine (ADDERALL) 10 MG tablet Take 20mg  each morning and 10mg  each afternoon 02/12/23   Delynn Flavin M, DO  amphetamine-dextroamphetamine (ADDERALL) 10 MG tablet Take 20mg  each morning and 10mg  each afternoon 03/14/23   Delynn Flavin M, DO  amphetamine-dextroamphetamine (ADDERALL) 10 MG tablet Take 20mg  each morning and 10mg  each afternoon 04/13/23   Delynn Flavin  M, DO  b complex vitamins capsule Take 1 capsule by mouth daily.    [provider]  buPROPion (WELLBUTRIN XL) 150 MG 24 hr tablet Take 1 tablet (150 mg total) by mouth daily. Dose reduction 11/04/22   Delynn Flavin M, DO  famotidine (PEPCID) 20 MG tablet Take 20 mg by mouth daily.    [provider]  fexofenadine (ALLEGRA) 180 MG tablet Take 180 mg by mouth daily.    [provider]  FISH OIL-KRILL OIL PO Take by mouth.    [provider]  FLUoxetine (PROZAC) 20 MG tablet Take 1 tablet (20 mg total) by mouth daily. 02/12/23   Raliegh Ip, DO  gabapentin (NEURONTIN) 300 MG capsule Take 300 mg by mouth 3 (three) times daily. 08/12/20   [provider]  levothyroxine (SYNTHROID) 175 MCG tablet TAKE ONE TABLET BY MOUTH DAILY BEFORE BREAKFAST 11/04/22   Delynn Flavin M, DO  lidocaine (LIDODERM) 5 % Place 1 patch onto the skin daily. Remove & Discard patch within 12 hours or as directed by MD 01/12/22   Sabas Sous, MD  methocarbamol (ROBAXIN) 500 MG tablet Take 1 tablet (500 mg total) by mouth every 8 (eight) hours as needed for muscle spasms. 01/12/22   Sabas Sous, MD  Multiple Vitamin (MULTIVITAMIN) tablet Take 1 tablet by mouth daily.    [provider]  nystatin (MYCOSTATIN) 100000 UNIT/ML suspension Take 5 mLs (500,000 Units total) by mouth 4 (four) times  daily. 04/01/22   Delynn Flavin M, DO  OVER THE COUNTER MEDICATION Vitamin D 3 2000 IU one capsule daily.    [provider]  Pseudoephedrine-Guaifenesin [phone] MG TB12 Take 1 tablet by mouth 2 (two) times daily. For congestion 12/16/22   Mechele Claude, MD  sucralfate (CARAFATE) 1 g tablet TAKE ONE TABLET BY MOUTH THREE TIMES DAILY AS NEEDED. 09/01/22   Delynn Flavin M, DO  sulfamethoxazole-trimethoprim (BACTRIM DS) 800-160 MG tablet Take 1 tablet by mouth 2 (two) times daily. Until gone, for infection 12/16/22   Mechele Claude, MD    Family History Family  History  Problem Relation Age of Onset   Breast cancer Mother    Cancer Mother    COPD Mother    Pancreatic cancer Father    COPD Father    Cancer Maternal Grandmother    COPD Maternal Grandmother    Colon cancer Neg Hx    Esophageal cancer Neg Hx    Rectal cancer Neg Hx    Stomach cancer Neg Hx     Social History Social History   Tobacco Use   Smoking status: Every Day    Current packs/day: 0.20    Average packs/day: 0.2 packs/day for 15.0 years (3.0 ttl pk-yrs)    Types: Cigarettes   Smokeless tobacco: Never  Vaping Use   Vaping status: Never Used  Substance Use Topics   Alcohol use: Not Currently    Comment: Rarely   Drug use: Never     Allergies   Patient has no known allergies.   Review of Systems Review of Systems Per HPI  Physical Exam Triage Vital Signs ED Triage Vitals  Encounter Vitals Group     BP 04/29/23 1328 126/62     Systolic BP Percentile --      Diastolic BP Percentile --      Pulse Rate 04/29/23 1328 87     Resp 04/29/23 1328 18     Temp 04/29/23 1328 98.4 F (36.9 C)     Temp Source 04/29/23 1328 Oral     SpO2 04/29/23 1328 98 %     Weight --      Height --      Head Circumference --      Peak Flow --      Pain Score 04/29/23 1329 5     Pain Loc --      Pain Education --      Exclude from Growth Chart --    No data found.  Updated Vital Signs BP 126/62 (BP Location: Right Arm)   Pulse 87   Temp 98.4 F (36.9 C) (Oral)   Resp 18   LMP  (LMP Unknown)   SpO2 98%   Visual Acuity Right Eye Distance:   Left Eye Distance:   Bilateral Distance:    Right Eye Near:   Left Eye Near:    Bilateral Near:     Physical Exam Vitals and nursing note reviewed.  Constitutional:      Appearance: Normal appearance. She is not ill-appearing.  HENT:     Head: Atraumatic.     Right Ear: Tympanic membrane normal.     Left Ear: Tympanic membrane normal.     Nose: Nose normal.     Mouth/Throat:     Mouth: Mucous membranes are moist.      Pharynx: Oropharynx is clear. No posterior oropharyngeal erythema.  Eyes:     Extraocular Movements: Extraocular movements intact.     Conjunctiva/sclera:  Conjunctivae normal.  Cardiovascular:     Rate and Rhythm: Normal rate and regular rhythm.     Heart sounds: Normal heart sounds.  Pulmonary:     Effort: Pulmonary effort is normal.     Breath sounds: Normal breath sounds. No wheezing or rales.  Abdominal:     General: Bowel sounds are normal. There is no distension.     Palpations: Abdomen is soft.     Tenderness: There is no abdominal tenderness. There is no guarding.  Musculoskeletal:        General: Normal range of motion.     Cervical back: Normal range of motion and neck supple.  Skin:    General: Skin is warm and dry.  Neurological:     Mental Status: She is alert and oriented to person, place, and time.  Psychiatric:        Mood and Affect: Mood normal.        Thought Content: Thought content normal.        Judgment: Judgment normal.      UC Treatments / Results  Labs (all labs ordered are listed, but only abnormal results are displayed) Labs Reviewed  POCT INFLUENZA A/B    EKG   Radiology No results found.  Procedures Procedures (including critical care time)  Medications Ordered in UC Medications - No data to display  Initial Impression / Assessment and Plan / UC Course  I have reviewed the triage vital signs and the nursing notes.  Pertinent labs & imaging results that were available during my care of the patient were reviewed by me and considered in my medical decision making (see chart for details).     Suspect postviral symptoms, possibly some seasonal allergies at play as well.  Discussed allergy regimen daily, supportive over-the-counter medications, home care.  Flu testing today negative.  Follow-up with PCP for recheck.  Final Clinical Impressions(s) / UC Diagnoses   Final diagnoses:  Other fatigue  Body aches  Chills  Seasonal  allergic rhinitis due to other allergic trigger     Discharge Instructions      Continue taking supportive over-the-counter medications as needed for your symptoms.  Start Zyrtec and Flonase daily for seasonal allergy control additionally.  Follow-up with your primary care provider next week for recheck, sooner if worsening at any time.    ED Prescriptions   None    PDMP not reviewed this encounter.   Particia Nearing, [place] 04/29/23 1534

## 2023-04-29 NOTE — Discharge Instructions (Signed)
Continue taking supportive over-the-counter medications as needed for your symptoms.  Start Zyrtec and Flonase daily for seasonal allergy control additionally.  Follow-up with your primary care provider next week for recheck, sooner if worsening at any time.

## 2023-04-29 NOTE — ED Triage Notes (Signed)
Pt reports she is fatigue, body aches, headaches, chills, or sweats and nausea  x 2- weeks a to a month

## 2023-05-04 ENCOUNTER — Emergency Department (HOSPITAL_COMMUNITY)
Admission: EM | Admit: 2023-05-04 | Discharge: 2023-05-04 | Disposition: A | Discharge status: Home / Self Care | Payer: Medicaid Other | Attending: Emergency Medicine | Admitting: Emergency Medicine

## 2023-05-04 ENCOUNTER — Other Ambulatory Visit: Payer: Self-pay

## 2023-05-04 ENCOUNTER — Encounter (HOSPITAL_COMMUNITY): Payer: Self-pay | Admitting: Emergency Medicine

## 2023-05-04 ENCOUNTER — Ambulatory Visit: Payer: Medicaid Other | Admitting: Family Medicine

## 2023-05-04 DIAGNOSIS — E063 Autoimmune thyroiditis: Secondary | ICD-10-CM | POA: Diagnosis not present

## 2023-05-04 DIAGNOSIS — J45909 Unspecified asthma, uncomplicated: Secondary | ICD-10-CM | POA: Insufficient documentation

## 2023-05-04 DIAGNOSIS — R519 Headache, unspecified: Secondary | ICD-10-CM | POA: Diagnosis present

## 2023-05-04 DIAGNOSIS — Z419 Encounter for procedure for purposes other than remedying health state, unspecified: Secondary | ICD-10-CM | POA: Diagnosis not present

## 2023-05-04 DIAGNOSIS — Z79890 Hormone replacement therapy (postmenopausal): Secondary | ICD-10-CM | POA: Diagnosis not present

## 2023-05-04 DIAGNOSIS — E039 Hypothyroidism, unspecified: Secondary | ICD-10-CM | POA: Diagnosis not present

## 2023-05-04 LAB — BASIC METABOLIC PANEL
Anion gap: 9 (ref 5–15)
BUN: 10 mg/dL (ref 6–20)
CO2: 28 mmol/L (ref 22–32)
Calcium: 8.9 mg/dL (ref 8.9–10.3)
Chloride: 101 mmol/L (ref 98–111)
Creatinine, Ser: 0.95 mg/dL (ref 0.44–1.00)
GFR, Estimated: >60 mL/min (ref >60)
Glucose, Bld: 133 mg/dL — ABNORMAL HIGH (ref 70–99)
Potassium: 3.8 mmol/L (ref 3.5–5.1)
Sodium: 138 mmol/L (ref 135–145)

## 2023-05-04 LAB — CBC WITH DIFFERENTIAL/PLATELET
Abs Immature Granulocytes: 0.03 10*3/uL (ref 0.00–0.07)
Basophils Absolute: 0 10*3/uL (ref 0.0–0.1)
Basophils Relative: 0 %
Eosinophils Absolute: 0.2 10*3/uL (ref 0.0–0.5)
Eosinophils Relative: 3 %
HCT: 42.1 % (ref 36.0–46.0)
Hemoglobin: 14.2 g/dL (ref 12.0–15.0)
Immature Granulocytes: 0 %
Lymphocytes Relative: 34 %
Lymphs Abs: 2.3 10*3/uL (ref 0.7–4.0)
MCH: 32.3 pg (ref 26.0–34.0)
MCHC: 33.7 g/dL (ref 30.0–36.0)
MCV: 95.9 fL (ref 80.0–100.0)
Monocytes Absolute: 0.5 10*3/uL (ref 0.1–1.0)
Monocytes Relative: 7 %
Neutro Abs: 3.9 10*3/uL (ref 1.7–7.7)
Neutrophils Relative %: 56 %
Platelets: 234 10*3/uL (ref 150–400)
RBC: 4.39 MIL/uL (ref 3.87–5.11)
RDW: 13.1 % (ref 11.5–15.5)
WBC: 6.9 10*3/uL (ref 4.0–10.5)
nRBC: 0 % (ref 0.0–0.2)

## 2023-05-04 LAB — MONONUCLEOSIS SCREEN: Mono Screen: NEGATIVE

## 2023-05-04 LAB — TSH: TSH: 9.79 u[IU]/mL — ABNORMAL HIGH (ref 0.350–4.500)

## 2023-05-04 MED ORDER — SODIUM CHLORIDE 0.9 % IV BOLUS
1000.0000 mL | Freq: Once | INTRAVENOUS | Status: AC
  Administered 2023-05-04: 1000 mL via INTRAVENOUS

## 2023-05-04 MED ORDER — DIPHENHYDRAMINE HCL 50 MG/ML IJ SOLN
12.5000 mg | Freq: Once | INTRAMUSCULAR | Status: AC
  Administered 2023-05-04: 12.5 mg via INTRAVENOUS
  Filled 2023-05-04: qty 1

## 2023-05-04 MED ORDER — LEVOTHYROXINE SODIUM 200 MCG PO TABS: 200.0000 ug | ORAL_TABLET | Freq: Every day | ORAL | 0 refills | Status: DC

## 2023-05-04 MED ORDER — DEXAMETHASONE SODIUM PHOSPHATE 10 MG/ML IJ SOLN
10.0000 mg | Freq: Once | INTRAMUSCULAR | Status: AC
  Administered 2023-05-04: 10 mg via INTRAVENOUS
  Filled 2023-05-04: qty 1

## 2023-05-04 MED ORDER — PROCHLORPERAZINE EDISYLATE 10 MG/2ML IJ SOLN
10.0000 mg | Freq: Once | INTRAMUSCULAR | Status: AC
  Administered 2023-05-04: 10 mg via INTRAVENOUS
  Filled 2023-05-04: qty 2

## 2023-05-04 NOTE — ED Notes (Signed)
Pt lying in bed. No apparent discomfort. Head has no lumps, bruising, bleeding or evident swelling.  Denies any recent trauma,nausea, or vomiting.

## 2023-05-04 NOTE — ED Triage Notes (Signed)
Pt presents with headache, sweats, drowsiness, restless legs, jaws clenching for unknown amount time, has been taking Prozac for several months and believes symptoms are side effects.

## 2023-05-04 NOTE — Discharge Instructions (Signed)
As discussed your TSH or thyroid-stimulating hormone is very elevated which means you are not getting enough thyroid replacement medication.  I am increasing your medicine to 200 mcg daily.  Plan follow-up care with your primary doctor as discussed.

## 2023-05-04 NOTE — ED Provider Notes (Signed)
[place] [facility] AT [facility] Provider Note   CSN: [id number] Arrival date & time: 05/04/23  1659     History  Chief Complaint  Patient presents with   Headache   Julie Alvarado is a 52 y.o. 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 She also has had diaphoresis, measured one temp of 101 last week prior to being seen at our [facility] at which time she tested negative for covid (son was positive).  She also states she had a sore throat which has resolved but now has extreme drowsiness, stating she cannot stay awake.  She was supposed to see her pcp today but fell asleep and missed her appt.    The history is provided by the patient.       Home Medications Prior to Admission medications   Medication Sig Start Date End Date Taking? Authorizing Provider  levothyroxine (SYNTHROID) 200 MCG tablet Take 1 tablet (200 mcg total) by mouth daily before breakfast. 05/04/23  Yes Izrael Peak, Raynelle Fanning, PA-C  amphetamine-dextroamphetamine (ADDERALL) 10 MG tablet Take 20mg  each morning and 10mg  each afternoon 02/12/23   Delynn Flavin M, DO  amphetamine-dextroamphetamine (ADDERALL) 10 MG tablet Take 20mg  each morning and 10mg  each afternoon 03/14/23   Delynn Flavin M, DO  amphetamine-dextroamphetamine (ADDERALL) 10 MG tablet Take 20mg  each morning and 10mg  each afternoon 04/13/23   Delynn Flavin M, DO  b complex vitamins capsule Take 1 capsule by mouth daily.    [provider]  buPROPion (WELLBUTRIN XL) 150 MG 24 hr tablet Take 1 tablet (150 mg total) by mouth daily. Dose reduction 11/04/22   Delynn Flavin M, DO  famotidine (PEPCID) 20 MG tablet Take 20 mg by mouth daily.    [provider]  fexofenadine (ALLEGRA) 180 MG  tablet Take 180 mg by mouth daily.    [provider]  FISH OIL-KRILL OIL PO Take by mouth.    [provider]  FLUoxetine (PROZAC) 20 MG tablet Take 1 tablet (20 mg total) by mouth daily. 02/12/23   Raliegh Ip, DO  gabapentin (NEURONTIN) 300 MG capsule Take 300 mg by mouth 3 (three) times daily. 08/12/20   [provider]  lidocaine (LIDODERM) 5 % Place 1 patch onto the skin daily. Remove & Discard patch within 12 hours or as directed by MD 01/12/22   Sabas Sous, MD  methocarbamol (ROBAXIN) 500 MG tablet Take 1 tablet (500 mg total) by mouth every 8 (eight) hours as needed for muscle spasms. 01/12/22   Sabas Sous, MD  Multiple Vitamin (MULTIVITAMIN) tablet Take 1 tablet by mouth daily.    [provider]  nystatin (MYCOSTATIN) 100000 UNIT/ML suspension Take 5 mLs (500,000 Units total) by mouth 4 (four) times daily. 04/01/22   Raliegh Ip, DO  OVER THE COUNTER MEDICATION Vitamin D 3 2000 IU one capsule daily.    [provider]  Pseudoephedrine-Guaifenesin [phone] MG TB12 Take 1 tablet by mouth 2 (two) times daily. For congestion 12/16/22   Mechele Claude, MD  sucralfate (CARAFATE) 1 g tablet TAKE ONE TABLET BY MOUTH THREE TIMES DAILY AS NEEDED. 09/01/22   Delynn Flavin M, DO  sulfamethoxazole-trimethoprim (BACTRIM DS) 800-160 MG tablet Take 1 tablet by mouth 2 (two) times daily. Until gone, for infection  12/16/22   Mechele Claude, MD      Allergies    Patient has no known allergies.    Review of Systems   Review of Systems  Constitutional:  Positive for diaphoresis, fatigue and fever.  HENT:  Negative for congestion and sore throat.        Jaw clenching  Eyes: Negative.   Respiratory:  Negative for cough, chest tightness and shortness of breath.   Cardiovascular:  Negative for chest pain.  Gastrointestinal:  Negative for abdominal pain, diarrhea, nausea and vomiting.  Genitourinary: Negative.   Musculoskeletal:  Negative  for arthralgias, joint swelling and neck pain.       New restless legs  Skin: Negative.  Negative for rash and wound.  Neurological:  Negative for dizziness, weakness, light-headedness, numbness and headaches.  Psychiatric/Behavioral: Negative.    All other systems reviewed and are negative.   Physical Exam Updated Vital Signs BP (!) 105/56 (BP Location: Left Arm)   Pulse 81   Temp 98.5 F (36.9 C) (Oral)   Resp 17   Ht 5\' 4"  (1.626 m)   Wt 106.1 kg   LMP  (LMP Unknown)   SpO2 93%   BMI 40.15 kg/m  Physical Exam Vitals and nursing note reviewed.  Constitutional:      Appearance: She is well-developed.  HENT:     Head: Normocephalic and atraumatic.     Mouth/Throat:     Mouth: Mucous membranes are moist.  Eyes:     Conjunctiva/sclera: Conjunctivae normal.  Cardiovascular:     Rate and Rhythm: Normal rate and regular rhythm.     Heart sounds: Normal heart sounds.  Pulmonary:     Effort: Pulmonary effort is normal.     Breath sounds: Normal breath sounds. No wheezing.  Abdominal:     General: Bowel sounds are normal.     Palpations: Abdomen is soft.     Tenderness: There is no abdominal tenderness.  Musculoskeletal:        General: Normal range of motion.     Cervical back: Normal range of motion. No rigidity.  Lymphadenopathy:     Cervical: No cervical adenopathy.  Skin:    General: Skin is warm and dry.     Findings: No rash.  Neurological:     Mental Status: She is alert.     ED Results / Procedures / Treatments   Labs (all labs ordered are listed, but only abnormal results are displayed) Labs Reviewed  BASIC METABOLIC PANEL - Abnormal; Notable for the following components:      Result Value   Glucose, Bld 133 (*)    All other components within normal limits  TSH - Abnormal; Notable for the following components:   TSH 9.790 (*)    All other components within normal limits  CBC WITH DIFFERENTIAL/PLATELET  T4, FREE  MONONUCLEOSIS SCREEN     EKG None  Radiology No results found.  Procedures Procedures    Medications Ordered in ED Medications  sodium chloride 0.9 % bolus 1,000 mL (0 mLs Intravenous Stopped 05/04/23 2230)  dexamethasone (DECADRON) injection 10 mg (10 mg Intravenous Given 05/04/23 1936)  prochlorperazine (COMPAZINE) injection 10 mg (10 mg Intravenous Given 05/04/23 1936)  diphenhydrAMINE (BENADRYL) injection 12.5 mg (12.5 mg Intravenous Given 05/04/23 1936)    ED Course/ Medical Decision Making/ A&P  Medical Decision Making Patient presenting with persistent and worsening fatigue, also with complaint of fever x 1 day last week but not since, her child tested positive for COVID last week but but she has had multiple COVID screens since then all have been negative.  She has a history of hypothyroidism, she had a sore throat which is now resolved, no known exposures to mono or strep.  She has a relatively normal exam, she has no cervical adenopathy, no pharyngitis or tonsillar hypertrophy.  That being said a monotest was completed given her description of severe fatigue which is negative.  Since she does have a high history of hypothyroidism the TSH level was also ordered and this is significantly elevated.  She is on 175 mcg of levothyroxine, she will be prescribed 200 mcg now but was asked to follow-up with her primary provider for further guidance regarding this.  Amount and/or Complexity of Data Reviewed Labs: ordered.    Details: Labs reviewed and significant for a negative mono, normal B med except for glucose of 133, her CBC is normal.  Her TSH is 9.79, her free T4 is low normal at 0.79.  Risk Prescription drug management.           Final Clinical Impression(s) / ED Diagnoses Final diagnoses:  Hypothyroidism due to Hashimoto thyroiditis    Rx / DC Orders ED Discharge Orders          Ordered    levothyroxine (SYNTHROID) 200 MCG tablet  Daily before  breakfast        05/04/23 2257              Burgess Amor, PA-C 05/06/23 2233    Terrilee Files, MD 05/07/23 1039

## 2023-05-05 ENCOUNTER — Encounter: Payer: Self-pay | Admitting: Family Medicine

## 2023-05-05 LAB — T4, FREE: Free T4: 0.79 ng/dL (ref 0.61–1.12)

## 2023-05-06 DIAGNOSIS — J0140 Acute pansinusitis, unspecified: Secondary | ICD-10-CM | POA: Diagnosis not present

## 2023-05-14 ENCOUNTER — Encounter: Payer: Self-pay | Admitting: Family Medicine

## 2023-05-14 ENCOUNTER — Telehealth: Payer: Medicaid Other | Admitting: Family Medicine

## 2023-05-14 DIAGNOSIS — E063 Autoimmune thyroiditis: Secondary | ICD-10-CM | POA: Diagnosis not present

## 2023-05-14 DIAGNOSIS — B028 Zoster with other complications: Secondary | ICD-10-CM | POA: Diagnosis not present

## 2023-05-14 MED ORDER — VALACYCLOVIR HCL 1 G PO TABS
1000.0000 mg | ORAL_TABLET | Freq: Three times a day (TID) | ORAL | 0 refills | Status: AC
Start: 2023-05-14 — End: 2023-05-21

## 2023-05-14 NOTE — Patient Instructions (Signed)
Shingles  Shingles, or herpes zoster, is an infection. It gives you a skin rash and blisters. These infected areas may hurt a lot. Shingles only happens if: You've had chickenpox. You've been given a shot called a vaccine to protect you from getting chickenpox. Shingles is rare in this case. What are the causes? Shingles is caused by a germ called the varicella-zoster virus. This is the same germ that causes chickenpox. After you're exposed to the germ, it stays in your body but is dormant. This means it isn't active. Shingles happens if the germ becomes active again. This can happen years after you're first exposed to the germ. What increases the risk? You may be more likely to get shingles if: You're older than 52 years of age. You're under a lot of stress. You have a weak immune system. The immune system is your body's defense system. It may be weak if: You have human immunodeficiency virus (HIV). You have acquired immunodeficiency syndrome (AIDS). You have cancer. You take medicines that weaken your immune system. These include organ transplant medicines. What are the signs or symptoms? The first symptoms of shingles may be itching, tingling, or pain. Your skin may feel like it's burning. A few days or weeks later, you'll get a rash. Here's what you can expect: The rash is likely to be on one side of your body. The rash may be shaped like a belt or a band. Over time, it will turn into blisters filled with fluid. The blisters will break open and change into scabs. The scabs will dry up in about 2-3 weeks. You may also have: A fever. Chills. A headache. Nausea. How is this diagnosed? Shingles is diagnosed with a skin exam. A sample called a culture may be taken from one of your blisters and sent to a lab. This will show if you have shingles. How is this treated? The rash may last for several weeks. There's no cure for shingles, but your health care provider may give you medicines.  These medicines may: Help with pain. Help with itching. Help with irritation and swelling. Help you get better sooner. Help to prevent long-term problems. If the rash is on your face, you may need to see an eye doctor or an ear, nose, and throat (ENT) doctor. Follow these instructions at home: Medicines Take your medicines only as told by your provider. Put an anti-itch cream or numbing cream on the rash or blisters as told by your provider. Relieving itching and discomfort  To help with itching: Put cold, wet cloths called cold compresses on the rash or blisters. Take a cool bath. Try adding baking soda or dry oatmeal to the water. Do not bathe in hot water. Use calamine lotion on the rash or blisters. You can get this type of lotion at the store. Blister and rash care Keep your rash covered with a loose bandage. Wear loose clothes that don't rub on your rash. Take care of your rash as told by your provider. Make sure you: Wash your hands with soap and water for at least 20 seconds before and after you change your bandage. If you can't use soap and water, use hand sanitizer. Keep your rash and blisters clean by washing them with mild soap and cool water. Change your bandage. Check your rash every day for signs of infection. Check for: More redness, swelling, or pain. Fluid or blood. Warmth. Pus or a bad smell. Do not scratch your rash. Do not pick at your  blisters. To help you not scratch: Keep your fingernails clean and cut short. Try to wear gloves or mittens when you sleep. General instructions Rest. Wash your hands often with soap and water for at least 20 seconds. If you can't use soap and water, use hand sanitizer. Washing your hands lowers your chance of getting a skin infection. Your infection can cause chickenpox in others. If you have blisters that aren't scabs yet, stay away from: Babies. Pregnant people. Children who have eczema. Older people who have organ  transplants. People who have a long-term, or chronic, illness. Anyone who hasn't had chickenpox before. Anyone who hasn't gotten the chickenpox vaccine. How is this prevented? Vaccines are the best way to prevent you from getting chickenpox or shingles. Talk with your provider about getting these shots. Where to find more information Centers for Disease Control and Prevention (CDC): [employer].no Contact a health care provider if: Your pain doesn't get better with medicine. Your pain doesn't get better after the rash heals. You have any signs of infection around the rash. Your rash or blisters get worse. You have a fever or chills. Get help right away if: The rash is on your face or nose. You have pain in your face or by your eye. You lose feeling on one side of your face. You have trouble seeing. You have ear pain or ringing in your ear. This information is not intended to replace advice given to you by your health care provider. Make sure you discuss any questions you have with your health care provider. Document Revised: 09/04/2022 Document Reviewed: 09/04/2022 Elsevier Patient Education  2024 [employer].

## 2023-05-14 NOTE — Progress Notes (Signed)
MyChart Video visit  Subjective: ZO:XWRU PCP: Raliegh Ip, DO EAV:WUJW Marando is a 52 y.o. female. Patient provides verbal consent for consult held via video.  Due to COVID-19 pandemic this visit was conducted virtually. This visit type was conducted due to national recommendations for restrictions regarding the COVID-19 Pandemic (e.g. social distancing, sheltering in place) in an effort to limit this patient's exposure and mitigate transmission in our community. All issues noted in this document were discussed and addressed.  A physical exam was not performed with this format.   Location of patient: home Location of provider: [facility] Others present for call: none  1.  Rash Patient reports that she developed a rash in the last day or so.  She notes that it is itchy, red, vesicular.  It is affecting her left upper extremity.  No known exposures.  She was recently treated for sinus infection with amoxicillin and also noted to have undertreated thyroid levels in the ER recently.  She been extremely exhausted for several days and sought care.  Noted to have a TSH of 9 despite treatment for thyroid.  Last levels were therapeutic 6 months ago.  Does not report any pain at the rash site.  She is received 1 shingles vaccination   ROS: Per HPI  No Known Allergies Past Medical History:  Diagnosis Date   Allergy    Anxiety    Asthma    Blood transfusion without reported diagnosis    Depression    GERD (gastroesophageal reflux disease)    Thyroid disease     Current Outpatient Medications:    amphetamine-dextroamphetamine (ADDERALL) 10 MG tablet, Take 20mg  each morning and 10mg  each afternoon, Disp: 90 tablet, Rfl: 0   amphetamine-dextroamphetamine (ADDERALL) 10 MG tablet, Take 20mg  each morning and 10mg  each afternoon, Disp: 90 tablet, Rfl: 0   amphetamine-dextroamphetamine (ADDERALL) 10 MG tablet, Take 20mg  each morning and 10mg  each afternoon, Disp: 90 tablet, Rfl: 0   b complex  vitamins capsule, Take 1 capsule by mouth daily., Disp: , Rfl:    buPROPion (WELLBUTRIN XL) 150 MG 24 hr tablet, Take 1 tablet (150 mg total) by mouth daily. Dose reduction, Disp: 90 tablet, Rfl: 3   famotidine (PEPCID) 20 MG tablet, Take 20 mg by mouth daily., Disp: , Rfl:    fexofenadine (ALLEGRA) 180 MG tablet, Take 180 mg by mouth daily., Disp: , Rfl:    FISH OIL-KRILL OIL PO, Take by mouth., Disp: , Rfl:    FLUoxetine (PROZAC) 20 MG tablet, Take 1 tablet (20 mg total) by mouth daily., Disp: 90 tablet, Rfl: 3   gabapentin (NEURONTIN) 300 MG capsule, Take 300 mg by mouth 3 (three) times daily., Disp: , Rfl:    levothyroxine (SYNTHROID) 200 MCG tablet, Take 1 tablet (200 mcg total) by mouth daily before breakfast., Disp: 30 tablet, Rfl: 0   lidocaine (LIDODERM) 5 %, Place 1 patch onto the skin daily. Remove & Discard patch within 12 hours or as directed by MD, Disp: 5 patch, Rfl: 0   methocarbamol (ROBAXIN) 500 MG tablet, Take 1 tablet (500 mg total) by mouth every 8 (eight) hours as needed for muscle spasms., Disp: 30 tablet, Rfl: 0   Multiple Vitamin (MULTIVITAMIN) tablet, Take 1 tablet by mouth daily., Disp: , Rfl:    nystatin (MYCOSTATIN) 100000 UNIT/ML suspension, Take 5 mLs (500,000 Units total) by mouth 4 (four) times daily., Disp: 240 mL, Rfl: 0   OVER THE COUNTER MEDICATION, Vitamin D 3 2000 IU one capsule daily.,  Disp: , Rfl:    Pseudoephedrine-Guaifenesin [phone] MG TB12, Take 1 tablet by mouth 2 (two) times daily. For congestion, Disp: 16 tablet, Rfl: 0   sucralfate (CARAFATE) 1 g tablet, TAKE ONE TABLET BY MOUTH THREE TIMES DAILY AS NEEDED., Disp: 42 tablet, Rfl: 0   sulfamethoxazole-trimethoprim (BACTRIM DS) 800-160 MG tablet, Take 1 tablet by mouth 2 (two) times daily. Until gone, for infection, Disp: 20 tablet, Rfl: 0  Gen: Tired but nontoxic female no acute distress. Skin: Vesicular rash appreciated extending from the medial ventral wrist along the elbow and a T1 dermatomal  pattern  Assessment/ Plan: 52 y.o. female   Herpes zoster with complication - Plan: valACYclovir (VALTREX) 1000 MG tablet  Hypothyroidism due to Hashimoto's thyroiditis - Plan: TSH, T4, free  Rash highly suspicious for shingles.  Following a T1 dermatome.  We discussed utilization of Valtrex.  Continue supportive and prescribed care for sinus infection.  I do question if the change in thyroid levels are related to acute illness.  We have an appointment to see her in 10 days and we will plan to repeat thyroid levels at that time.  We discussed signs and symptoms of hyperthyroidism and reasons to seek evaluation.  Start time: 9:13a End time: 9:26a  Total time spent on patient care (including video visit/ documentation): 15 minutes  Elise Knobloch Hulen Skains, DO [facility] [phone]

## 2023-05-24 ENCOUNTER — Encounter: Payer: Self-pay | Admitting: Family Medicine

## 2023-05-24 ENCOUNTER — Ambulatory Visit: Payer: Medicaid Other | Admitting: Family Medicine

## 2023-05-24 VITALS — BP 114/70 | HR 96 | Temp 98.8°F | Ht 64.0 in | Wt 226.8 lb

## 2023-05-24 DIAGNOSIS — E063 Autoimmune thyroiditis: Secondary | ICD-10-CM

## 2023-05-24 DIAGNOSIS — F900 Attention-deficit hyperactivity disorder, predominantly inattentive type: Secondary | ICD-10-CM

## 2023-05-24 DIAGNOSIS — R110 Nausea: Secondary | ICD-10-CM

## 2023-05-24 DIAGNOSIS — R21 Rash and other nonspecific skin eruption: Secondary | ICD-10-CM

## 2023-05-24 DIAGNOSIS — R519 Headache, unspecified: Secondary | ICD-10-CM

## 2023-05-24 MED ORDER — METHYLPREDNISOLONE ACETATE 80 MG/ML IJ SUSP
80.0000 mg | Freq: Once | INTRAMUSCULAR | Status: DC
Start: 2023-05-24 — End: 2023-08-25

## 2023-05-24 MED ORDER — HYDROXYZINE PAMOATE 25 MG PO CAPS
25.0000 mg | ORAL_CAPSULE | Freq: Three times a day (TID) | ORAL | 0 refills | Status: DC | PRN
Start: 2023-05-24 — End: 2023-06-29

## 2023-05-24 MED ORDER — PREDNISONE 10 MG (21) PO TBPK
ORAL_TABLET | ORAL | 0 refills | Status: DC
Start: 2023-05-24 — End: 2023-09-22

## 2023-05-24 MED ORDER — ONDANSETRON 4 MG PO TBDP
4.0000 mg | ORAL_TABLET | Freq: Three times a day (TID) | ORAL | 0 refills | Status: DC | PRN
Start: 2023-05-24 — End: 2024-05-01

## 2023-05-24 MED ORDER — FLUTICASONE PROPIONATE 50 MCG/ACT NA SUSP
2.0000 | Freq: Every day | NASAL | 6 refills | Status: AC
Start: 2023-05-24 — End: ?

## 2023-05-24 NOTE — Progress Notes (Signed)
Subjective: CC: ADHD follow-up PCP: Raliegh Ip, DO HQI:ONGE Thielke is a 52 y.o. female presenting to clinic today for:  1.  ADHD follow-up/sinus headache/hypothyroidism Patient reports that she has actually not been taking her Adderall despite it having been refilled.  She still has 2 full bottles at home.  A couple of months ago she discontinued use because she started getting random headaches.  She later found out that she was exposed to COVID though tested negative herself.  Despite this negative test she had multiple sinus symptoms and has been combating issues with sinus headache, fatigue etc. ever since.  She has been utilizing Allegra D with some improvement in symptoms.  When she skipped it her headaches returned.  She does not report any fevers, purulence from nares.  She has been treated with oral antibiotics for presumed infection.  She notes that the branded that she has right now of the Adderall is not the typical brand that she normally uses and wonders if perhaps that is what is causing some of these headaches.  She has not revisited utilization of the medication because of this and notes that her ADHD has been totally uncontrolled.  She has been on higher doses of Synthroid, this was increased after she was found to be extremely fatigued and her TSH was noted to be abnormal in the ER  2.  Rash She reports the rash seems to be spreading some along that left upper extremity but she has gross itching along the abdomen and other arm as well though no appreciable rash on those areas.  She finished her Valtrex.  Reports diffuse itching that is not relieved by her Allegra.  Has had some intermittent nausea   ROS: Per HPI  No Known Allergies Past Medical History:  Diagnosis Date   Allergy    Anxiety    Asthma    Blood transfusion without reported diagnosis    Depression    GERD (gastroesophageal reflux disease)    Thyroid disease     Current Outpatient Medications:     amphetamine-dextroamphetamine (ADDERALL) 10 MG tablet, Take 20mg  each morning and 10mg  each afternoon, Disp: 90 tablet, Rfl: 0   amphetamine-dextroamphetamine (ADDERALL) 10 MG tablet, Take 20mg  each morning and 10mg  each afternoon, Disp: 90 tablet, Rfl: 0   amphetamine-dextroamphetamine (ADDERALL) 10 MG tablet, Take 20mg  each morning and 10mg  each afternoon, Disp: 90 tablet, Rfl: 0   b complex vitamins capsule, Take 1 capsule by mouth daily., Disp: , Rfl:    buPROPion (WELLBUTRIN XL) 150 MG 24 hr tablet, Take 1 tablet (150 mg total) by mouth daily. Dose reduction, Disp: 90 tablet, Rfl: 3   famotidine (PEPCID) 20 MG tablet, Take 20 mg by mouth daily., Disp: , Rfl:    fexofenadine (ALLEGRA) 180 MG tablet, Take 180 mg by mouth daily., Disp: , Rfl:    FISH OIL-KRILL OIL PO, Take by mouth., Disp: , Rfl:    FLUoxetine (PROZAC) 20 MG tablet, Take 1 tablet (20 mg total) by mouth daily., Disp: 90 tablet, Rfl: 3   fluticasone (FLONASE) 50 MCG/ACT nasal spray, Place 2 sprays into both nostrils daily., Disp: 16 g, Rfl: 6   gabapentin (NEURONTIN) 300 MG capsule, Take 300 mg by mouth 3 (three) times daily., Disp: , Rfl:    hydrOXYzine (VISTARIL) 25 MG capsule, Take 1 capsule (25 mg total) by mouth every 8 (eight) hours as needed for itching (causes sleepiness)., Disp: 30 capsule, Rfl: 0   levothyroxine (SYNTHROID) 200 MCG tablet,  Take 1 tablet (200 mcg total) by mouth daily before breakfast., Disp: 30 tablet, Rfl: 0   lidocaine (LIDODERM) 5 %, Place 1 patch onto the skin daily. Remove & Discard patch within 12 hours or as directed by MD, Disp: 5 patch, Rfl: 0   methocarbamol (ROBAXIN) 500 MG tablet, Take 1 tablet (500 mg total) by mouth every 8 (eight) hours as needed for muscle spasms., Disp: 30 tablet, Rfl: 0   Multiple Vitamin (MULTIVITAMIN) tablet, Take 1 tablet by mouth daily., Disp: , Rfl:    nystatin (MYCOSTATIN) 100000 UNIT/ML suspension, Take 5 mLs (500,000 Units total) by mouth 4 (four) times daily.,  Disp: 240 mL, Rfl: 0   ondansetron (ZOFRAN-ODT) 4 MG disintegrating tablet, Take 1 tablet (4 mg total) by mouth every 8 (eight) hours as needed for nausea or vomiting., Disp: 20 tablet, Rfl: 0   OVER THE COUNTER MEDICATION, Vitamin D 3 2000 IU one capsule daily., Disp: , Rfl:    predniSONE (STERAPRED UNI-PAK 21 TAB) 10 MG (21) TBPK tablet, As directed x 6 days. Start with breakfast 05/25/23, Disp: 21 tablet, Rfl: 0   Pseudoephedrine-Guaifenesin [phone] MG TB12, Take 1 tablet by mouth 2 (two) times daily. For congestion, Disp: 16 tablet, Rfl: 0   sucralfate (CARAFATE) 1 g tablet, TAKE ONE TABLET BY MOUTH THREE TIMES DAILY AS NEEDED., Disp: 42 tablet, Rfl: 0  Current Facility-Administered Medications:    methylPREDNISolone acetate (DEPO-MEDROL) injection 80 mg, 80 mg, Intramuscular, Once,  Social History   Socioeconomic History   Marital status: Single    Spouse name: Not on file   Number of children: Not on file   Years of education: Not on file   Highest education level: Not on file  Occupational History   Not on file  Tobacco Use   Smoking status: Every Day    Current packs/day: 0.20    Average packs/day: 0.2 packs/day for 15.0 years (3.0 ttl pk-yrs)    Types: Cigarettes   Smokeless tobacco: Never  Vaping Use   Vaping status: Never Used  Substance and Sexual Activity   Alcohol use: Not Currently    Comment: Rarely   Drug use: Never   Sexual activity: Yes    Birth control/protection: I.U.D.  Other Topics Concern   Not on file  Social History Narrative   Not on file   Social Determinants of Health   Financial Resource Strain: Not on file  Food Insecurity: No Food Insecurity (06/10/2022)   Received from [facility], [facility]   Hunger Vital Sign    Worried About Running Out of Food in the Last Year: Never true    Ran Out of Food in the Last Year: Never true  Transportation Needs: Not on file  Physical Activity: Not on file  Stress: Not on file  Social Connections:  Unknown (12/16/2021)   Received from [facility], [facility]   Social Network    Social Network: Not on file  Intimate Partner Violence: Unknown (11/07/2021)   Received from [facility], [facility]   HITS    Physically Hurt: Not on file    Insult or Talk Down To: Not on file    Threaten Physical Harm: Not on file    Scream or Curse: Not on file   Family History  Problem Relation Age of Onset   Breast cancer Mother    Cancer Mother    COPD Mother    Pancreatic cancer Father    COPD Father    Cancer  Maternal Grandmother    COPD Maternal Grandmother    Colon cancer Neg Hx    Esophageal cancer Neg Hx    Rectal cancer Neg Hx    Stomach cancer Neg Hx     Objective: Office vital signs reviewed. BP 114/70   Pulse 96   Temp 98.8 F (37.1 C)   Ht 5\' 4"  (1.626 m)   Wt 226 lb 12.8 oz (102.9 kg)   LMP  (LMP Unknown)   SpO2 95%   BMI 38.93 kg/m   Physical Examination:  General: Awake, alert, well nourished, No acute distress HEENT: Normal    Neck: No masses palpated. No lymphadenopathy    Ears: Tympanic membranes intact, normal light reflex, no erythema, no bulging    Eyes: PERRLA, extraocular membranes intact, sclera white    Nose: nasal turbinates moist, clear nasal discharge    Throat: moist mucus membranes, no erythema, no tonsillar exudate.  Airway is patent Cardio: regular rate and rhythm, S1S2 heard, no murmurs appreciated Pulm: clear to auscultation bilaterally, no wheezes, rhonchi or rales; normal work of breathing on room air Neuro: No focal neurologic deficits identified     05/24/2023    9:14 AM 11/04/2022    2:27 PM 08/04/2022    4:17 PM  Depression screen PHQ 2/9  Decreased Interest 1 1 0  Down, Depressed, Hopeless 1 1 1   PHQ - 2 Score 2 2 1   Altered sleeping 2 1 0  Tired, decreased energy 2 2 1   Change in appetite 3 1 2   Feeling bad or failure about yourself  1 1 2   Trouble concentrating 0 0 0  Moving slowly or fidgety/restless 0 0 0  Suicidal  thoughts 0 0 0  PHQ-9 Score 10 7 6   Difficult doing work/chores Somewhat difficult Somewhat difficult       05/24/2023    9:14 AM 11/04/2022    2:22 PM 08/04/2022    4:18 PM 07/03/2022    9:52 AM  GAD 7 : Generalized Anxiety Score  Nervous, Anxious, on Edge 1 1 0 0  Control/stop worrying 2 1 3  0  Worry too much - different things 0 1 3 0  Trouble relaxing 1 1 3 1   Restless 0 0 0 0  Easily annoyed or irritable 1 1 1 1   Afraid - awful might happen 1 1 3 1   Total GAD 7 Score 6 6 13 3   Anxiety Difficulty  Somewhat difficult Somewhat difficult Somewhat difficult    Assessment/ Plan: 52 y.o. female   Attention deficit hyperactivity disorder (ADHD), predominantly inattentive type  Hypothyroidism due to Hashimoto's thyroiditis - Plan: T4, free, TSH  Rash - Plan: methylPREDNISolone acetate (DEPO-MEDROL) injection 80 mg, predniSONE (STERAPRED UNI-PAK 21 TAB) 10 MG (21) TBPK tablet, hydrOXYzine (VISTARIL) 25 MG capsule  Sinus headache - Plan: fluticasone (FLONASE) 50 MCG/ACT nasal spray  Nausea - Plan: ondansetron (ZOFRAN-ODT) 4 MG disintegrating tablet  ADHD is chronic and not currently controlled but she has been off of medication.  Encouraged her to trial this again.  If causes headache again she will contact me with what pharmacies may have one of the alternative brands that she knows works well for her.  Glad to send it to a different pharmacy.  She is up-to-date on UDS and CSC and the national narcotic database was reviewed with no red flags  Check thyroid levels given abnormality and recent dose adjustment by outside provider  Prednisone Dosepak and Depo-Medrol administered today for rash.  I  previously thought this to be consistent with a shingles rash but given that it seems to be crossing into the C7 dermatome of not entirely sure that this diagnosis is still accurate.  I am going to treat her as a contact dermatitis instead.  Home care instructions reviewed.  Follow-up as  needed  Encouraged use of Flonase for sinus headache.  Will plan for referral to ENT if symptoms are persistent despite treatment with oral antihistamine, decongestant and nasal spray  Zofran sent in for as needed use   Casha Estupinan Hulen Skains, DO [facility] [phone]

## 2023-05-24 NOTE — Patient Instructions (Signed)
Let me know what happens with the Adderall and if it needs to be sent elsewhere. That rash looks to be crossing dermatomes, which makes me think I was incorrect with shingles diagnoses.  Hopefully, you'll see resolution of itching and improvement in rash after steroid. Hydroxyzine sent if needed for breakthrough itching

## 2023-05-25 LAB — T4, FREE: Free T4: 1.52 ng/dL (ref 0.82–1.77)

## 2023-05-25 LAB — TSH: TSH: 1.85 u[IU]/mL (ref 0.450–4.500)

## 2023-06-03 ENCOUNTER — Encounter: Payer: Self-pay | Admitting: Family Medicine

## 2023-06-03 MED ORDER — LEVOTHYROXINE SODIUM 200 MCG PO TABS: 200.0000 ug | ORAL_TABLET | Freq: Every day | ORAL | 10 refills | Status: DC

## 2023-06-04 DIAGNOSIS — Z419 Encounter for procedure for purposes other than remedying health state, unspecified: Secondary | ICD-10-CM | POA: Diagnosis not present

## 2023-06-29 ENCOUNTER — Encounter (INDEPENDENT_AMBULATORY_CARE_PROVIDER_SITE_OTHER): Payer: Medicaid Other | Admitting: Family Medicine

## 2023-06-29 ENCOUNTER — Other Ambulatory Visit: Payer: Self-pay | Admitting: Family Medicine

## 2023-06-29 DIAGNOSIS — F329 Major depressive disorder, single episode, unspecified: Secondary | ICD-10-CM

## 2023-06-29 DIAGNOSIS — R21 Rash and other nonspecific skin eruption: Secondary | ICD-10-CM

## 2023-06-29 MED ORDER — HYDROXYZINE PAMOATE 25 MG PO CAPS: 25.0000 mg | ORAL_CAPSULE | Freq: Three times a day (TID) | ORAL | 0 refills | Status: DC | PRN

## 2023-06-29 MED ORDER — FLUOXETINE HCL 10 MG PO CAPS
ORAL_CAPSULE | ORAL | 0 refills | Status: DC
Start: 2023-06-29 — End: 2023-09-22

## 2023-06-29 NOTE — Addendum Note (Signed)
Addended by: Raliegh Ip on: 06/29/2023 01:01 PM   Modules accepted: Orders

## 2023-06-29 NOTE — Telephone Encounter (Signed)

## 2023-07-04 DIAGNOSIS — Z419 Encounter for procedure for purposes other than remedying health state, unspecified: Secondary | ICD-10-CM | POA: Diagnosis not present

## 2023-08-25 ENCOUNTER — Ambulatory Visit: Payer: Medicaid Other | Admitting: Family Medicine

## 2023-08-25 NOTE — Progress Notes (Deleted)
Subjective: CC:*** PCP: Raliegh Ip, DO Julie Alvarado is a 53 y.o. female presenting to clinic today for:  1. ***   ROS: Per HPI  No Known Allergies Past Medical History:  Diagnosis Date   Allergy    Anxiety    Asthma    Blood transfusion without reported diagnosis    Depression    GERD (gastroesophageal reflux disease)    Thyroid disease     Current Outpatient Medications:    amphetamine-dextroamphetamine (ADDERALL) 10 MG tablet, Take 20mg  each morning and 10mg  each afternoon, Disp: 90 tablet, Rfl: 0   amphetamine-dextroamphetamine (ADDERALL) 10 MG tablet, Take 20mg  each morning and 10mg  each afternoon, Disp: 90 tablet, Rfl: 0   amphetamine-dextroamphetamine (ADDERALL) 10 MG tablet, Take 20mg  each morning and 10mg  each afternoon, Disp: 90 tablet, Rfl: 0   b complex vitamins capsule, Take 1 capsule by mouth daily., Disp: , Rfl:    buPROPion (WELLBUTRIN XL) 150 MG 24 hr tablet, Take 1 tablet (150 mg total) by mouth daily. Dose reduction, Disp: 90 tablet, Rfl: 3   famotidine (PEPCID) 20 MG tablet, Take 20 mg by mouth daily., Disp: , Rfl:    fexofenadine (ALLEGRA) 180 MG tablet, Take 180 mg by mouth daily., Disp: , Rfl:    FISH OIL-KRILL OIL PO, Take by mouth., Disp: , Rfl:    FLUoxetine (PROZAC) 10 MG capsule, Take 1 capsule (10 mg total) by mouth daily for 28 days, THEN 1 capsule (10 mg total) every other day for 14 days. Then stop., Disp: 35 capsule, Rfl: 0   fluticasone (FLONASE) 50 MCG/ACT nasal spray, Place 2 sprays into both nostrils daily., Disp: 16 g, Rfl: 6   gabapentin (NEURONTIN) 300 MG capsule, Take 300 mg by mouth 3 (three) times daily., Disp: , Rfl:    hydrOXYzine (VISTARIL) 25 MG capsule, Take 1 capsule (25 mg total) by mouth every 8 (eight) hours as needed for itching (causes sleepiness)., Disp: 30 capsule, Rfl: 0   levothyroxine (SYNTHROID) 200 MCG tablet, Take 1 tablet (200 mcg total) by mouth daily before breakfast., Disp: 30 tablet, Rfl: 10    lidocaine (LIDODERM) 5 %, Place 1 patch onto the skin daily. Remove & Discard patch within 12 hours or as directed by MD, Disp: 5 patch, Rfl: 0   methocarbamol (ROBAXIN) 500 MG tablet, Take 1 tablet (500 mg total) by mouth every 8 (eight) hours as needed for muscle spasms., Disp: 30 tablet, Rfl: 0   Multiple Vitamin (MULTIVITAMIN) tablet, Take 1 tablet by mouth daily., Disp: , Rfl:    nystatin (MYCOSTATIN) 100000 UNIT/ML suspension, Take 5 mLs (500,000 Units total) by mouth 4 (four) times daily., Disp: 240 mL, Rfl: 0   ondansetron (ZOFRAN-ODT) 4 MG disintegrating tablet, Take 1 tablet (4 mg total) by mouth every 8 (eight) hours as needed for nausea or vomiting., Disp: 20 tablet, Rfl: 0   OVER THE COUNTER MEDICATION, Vitamin D 3 2000 IU one capsule daily., Disp: , Rfl:    predniSONE (STERAPRED UNI-PAK 21 TAB) 10 MG (21) TBPK tablet, As directed x 6 days. Start with breakfast 05/25/23, Disp: 21 tablet, Rfl: 0   Pseudoephedrine-Guaifenesin [phone] MG TB12, Take 1 tablet by mouth 2 (two) times daily. For congestion, Disp: 16 tablet, Rfl: 0   sucralfate (CARAFATE) 1 g tablet, TAKE ONE TABLET BY MOUTH THREE TIMES DAILY AS NEEDED., Disp: 42 tablet, Rfl: 0  Current Facility-Administered Medications:    methylPREDNISolone acetate (DEPO-MEDROL) injection 80 mg, 80 mg, Intramuscular, Once,  Social History  Socioeconomic History   Marital status: Single    Spouse name: Not on file   Number of children: Not on file   Years of education: Not on file   Highest education level: Not on file  Occupational History   Not on file  Tobacco Use   Smoking status: Every Day    Current packs/day: 0.20    Average packs/day: 0.2 packs/day for 15.0 years (3.0 ttl pk-yrs)    Types: Cigarettes   Smokeless tobacco: Never  Vaping Use   Vaping status: Never Used  Substance and Sexual Activity   Alcohol use: Not Currently    Comment: Rarely   Drug use: Never   Sexual activity: Yes    Birth control/protection:  I.U.D.  Other Topics Concern   Not on file  Social History Narrative   Not on file   Social Drivers of Health   Financial Resource Strain: Not on file  Food Insecurity: No Food Insecurity (06/10/2022)   Received from [facility], [facility]   Hunger Vital Sign    Worried About Running Out of Food in the Last Year: Never true    Ran Out of Food in the Last Year: Never true  Transportation Needs: Not on file  Physical Activity: Not on file  Stress: Not on file  Social Connections: Unknown (12/16/2021)   Received from [facility], [facility]   Social Network    Social Network: Not on file  Intimate Partner Violence: Unknown (11/07/2021)   Received from [facility], [facility]   HITS    Physically Hurt: Not on file    Insult or Talk Down To: Not on file    Threaten Physical Harm: Not on file    Scream or Curse: Not on file   Family History  Problem Relation Age of Onset   Breast cancer Mother    Cancer Mother    COPD Mother    Pancreatic cancer Father    COPD Father    Cancer Maternal Grandmother    COPD Maternal Grandmother    Colon cancer Neg Hx    Esophageal cancer Neg Hx    Rectal cancer Neg Hx    Stomach cancer Neg Hx     Objective: Office vital signs reviewed. There were no vitals taken for this visit.  Physical Examination:  General: Awake, alert, *** nourished, No acute distress HEENT: Normal    Neck: No masses palpated. No lymphadenopathy    Ears: Tympanic membranes intact, normal light reflex, no erythema, no bulging    Eyes: PERRLA, extraocular membranes intact, sclera ***    Nose: nasal turbinates moist, *** nasal discharge    Throat: moist mucus membranes, no erythema, *** tonsillar exudate.  Airway is patent Cardio: regular rate and rhythm, S1S2 heard, no murmurs appreciated Pulm: clear to auscultation bilaterally, no wheezes, rhonchi or rales; normal work of breathing on room air GI: soft, non-tender, non-distended, bowel sounds present  x4, no hepatomegaly, no splenomegaly, no masses GU: external vaginal tissue ***, cervix ***, *** punctate lesions on cervix appreciated, *** discharge from cervical os, *** bleeding, *** cervical motion tenderness, *** abdominal/ adnexal masses Extremities: warm, well perfused, No edema, cyanosis or clubbing; +*** pulses bilaterally MSK: *** gait and *** station Skin: dry; intact; no rashes or lesions Neuro: *** Strength and light touch sensation grossly intact, *** DTRs ***/4  Assessment/ Plan: 53 y.o. female   Reactive depression  ***   Lesley Atkin [employer], DO [facility] [phone]

## 2023-08-26 ENCOUNTER — Encounter: Payer: Self-pay | Admitting: Family Medicine

## 2023-09-04 DIAGNOSIS — Z419 Encounter for procedure for purposes other than remedying health state, unspecified: Secondary | ICD-10-CM | POA: Diagnosis not present

## 2023-09-22 ENCOUNTER — Encounter: Payer: Self-pay | Admitting: Family Medicine

## 2023-09-22 ENCOUNTER — Ambulatory Visit (INDEPENDENT_AMBULATORY_CARE_PROVIDER_SITE_OTHER): Payer: Medicaid Other | Admitting: Family Medicine

## 2023-09-22 VITALS — BP 120/76 | HR 84 | Temp 98.3°F | Ht 64.0 in | Wt 233.0 lb

## 2023-09-22 DIAGNOSIS — F900 Attention-deficit hyperactivity disorder, predominantly inattentive type: Secondary | ICD-10-CM | POA: Diagnosis not present

## 2023-09-22 DIAGNOSIS — E063 Autoimmune thyroiditis: Secondary | ICD-10-CM | POA: Diagnosis not present

## 2023-09-22 DIAGNOSIS — F439 Reaction to severe stress, unspecified: Secondary | ICD-10-CM

## 2023-09-22 DIAGNOSIS — Z23 Encounter for immunization: Secondary | ICD-10-CM

## 2023-09-22 DIAGNOSIS — S025XXA Fracture of tooth (traumatic), initial encounter for closed fracture: Secondary | ICD-10-CM

## 2023-09-22 DIAGNOSIS — F329 Major depressive disorder, single episode, unspecified: Secondary | ICD-10-CM

## 2023-09-22 MED ORDER — SERTRALINE HCL 50 MG PO TABS: 50.0000 mg | ORAL_TABLET | Freq: Every day | ORAL | 0 refills | Status: DC

## 2023-09-22 MED ORDER — AMPHETAMINE-DEXTROAMPHETAMINE 10 MG PO TABS
ORAL_TABLET | ORAL | 0 refills | Status: DC
Start: 2023-11-19 — End: 2023-12-22

## 2023-09-22 MED ORDER — AMPHETAMINE-DEXTROAMPHETAMINE 10 MG PO TABS
ORAL_TABLET | ORAL | 0 refills | Status: DC
Start: 2023-10-20 — End: 2023-12-22

## 2023-09-22 MED ORDER — AMOXICILLIN 875 MG PO TABS
875.0000 mg | ORAL_TABLET | Freq: Two times a day (BID) | ORAL | 0 refills | Status: AC
Start: 2023-09-22 — End: 2023-10-02

## 2023-09-22 MED ORDER — NAPROXEN 500 MG PO TBEC: 500.0000 mg | DELAYED_RELEASE_TABLET | Freq: Two times a day (BID) | ORAL | 0 refills | Status: DC | PRN

## 2023-09-22 MED ORDER — AMPHETAMINE-DEXTROAMPHETAMINE 10 MG PO TABS
ORAL_TABLET | ORAL | 0 refills | Status: DC
Start: 2023-09-22 — End: 2023-12-22

## 2023-09-22 MED ORDER — BUPROPION HCL ER (XL) 150 MG PO TB24
150.0000 mg | ORAL_TABLET | Freq: Every day | ORAL | 3 refills | Status: DC
Start: 2023-09-22 — End: 2024-05-09

## 2023-09-22 NOTE — Progress Notes (Signed)
Subjective: CC: ADHD follow-up PCP: Julie Alvarado Julie Alvarado:GNFA Alvarado is a 53 y.o. female presenting to clinic today for:  1.  ADHD/ depression/ caregiver stress She reports ADHD is still a challenge.  She has been trying to be very sparing with her Adderall as of late because she was running low.  She reports no complications from the medication.  Overall the headaches and other unusual symptoms she was having resolved after discontinuation of Prozac.  She still struggles with depressive symptoms despite compliance with Wellbutrin and is not entirely sure that the Wellbutrin helps at the dose that she is taking but it certainly was not helpful at higher doses.  She has considered revisiting an antidepressant because she does find that she is not completing tasks that she otherwise should be and feels like she is not caring through with things that her son needs at this time.  She continues to care for her sick father.  She does admit to caregiver burnout but unfortunately he has not qualified for additional services at home through his insurance.  She subsequently has really not been taking good care of herself  2.  Hypothyroidism She is compliant with her thyroid medication.  Her extreme fatigue has resolved but she has had some weight gain and depressive symptoms as above.  3.  Broken tooth She reports she has 2 broken teeth.  She contacted her dentist but they have not contacted her back for an appointment yet.  She thinks she needs an antibiotic for the right lower tooth and may be an NSAID.  She has been utilizing ibuprofen but is worried about causing GI upset with this   ROS: Per HPI  No Known Allergies Past Medical History:  Diagnosis Date   Allergy    Anxiety    Asthma    Blood transfusion without reported diagnosis    Depression    GERD (gastroesophageal reflux disease)    Thyroid disease     Current Outpatient Medications:    amphetamine-dextroamphetamine  (ADDERALL) 10 MG tablet, Take 20mg  each morning and 10mg  each afternoon, Disp: 90 tablet, Rfl: 0   amphetamine-dextroamphetamine (ADDERALL) 10 MG tablet, Take 20mg  each morning and 10mg  each afternoon, Disp: 90 tablet, Rfl: 0   amphetamine-dextroamphetamine (ADDERALL) 10 MG tablet, Take 20mg  each morning and 10mg  each afternoon, Disp: 90 tablet, Rfl: 0   b complex vitamins capsule, Take 1 capsule by mouth daily., Disp: , Rfl:    buPROPion (WELLBUTRIN XL) 150 MG 24 hr tablet, Take 1 tablet (150 mg total) by mouth daily. Dose reduction, Disp: 90 tablet, Rfl: 3   famotidine (PEPCID) 20 MG tablet, Take 20 mg by mouth daily., Disp: , Rfl:    fexofenadine (ALLEGRA) 180 MG tablet, Take 180 mg by mouth daily., Disp: , Rfl:    FISH OIL-KRILL OIL PO, Take by mouth., Disp: , Rfl:    FLUoxetine (PROZAC) 10 MG capsule, Take 1 capsule (10 mg total) by mouth daily for 28 days, THEN 1 capsule (10 mg total) every other day for 14 days. Then stop., Disp: 35 capsule, Rfl: 0   fluticasone (FLONASE) 50 MCG/ACT nasal spray, Place 2 sprays into both nostrils daily., Disp: 16 g, Rfl: 6   gabapentin (NEURONTIN) 300 MG capsule, Take 300 mg by mouth 3 (three) times daily., Disp: , Rfl:    hydrOXYzine (VISTARIL) 25 MG capsule, Take 1 capsule (25 mg total) by mouth every 8 (eight) hours as needed for itching (causes sleepiness)., Disp: 30 capsule,  Rfl: 0   levothyroxine (SYNTHROID) 200 MCG tablet, Take 1 tablet (200 mcg total) by mouth daily before breakfast., Disp: 30 tablet, Rfl: 10   lidocaine (LIDODERM) 5 %, Place 1 patch onto the skin daily. Remove & Discard patch within 12 hours or as directed by MD, Disp: 5 patch, Rfl: 0   methocarbamol (ROBAXIN) 500 MG tablet, Take 1 tablet (500 mg total) by mouth every 8 (eight) hours as needed for muscle spasms., Disp: 30 tablet, Rfl: 0   Multiple Vitamin (MULTIVITAMIN) tablet, Take 1 tablet by mouth daily., Disp: , Rfl:    nystatin (MYCOSTATIN) 100000 UNIT/ML suspension, Take 5 mLs  (500,000 Units total) by mouth 4 (four) times daily., Disp: 240 mL, Rfl: 0   ondansetron (ZOFRAN-ODT) 4 MG disintegrating tablet, Take 1 tablet (4 mg total) by mouth every 8 (eight) hours as needed for nausea or vomiting., Disp: 20 tablet, Rfl: 0   OVER THE COUNTER MEDICATION, Vitamin D 3 2000 IU one capsule daily., Disp: , Rfl:    predniSONE (STERAPRED UNI-PAK 21 TAB) 10 MG (21) TBPK tablet, As directed x 6 days. Start with breakfast 05/25/23, Disp: 21 tablet, Rfl: 0   Pseudoephedrine-Guaifenesin [phone] MG TB12, Take 1 tablet by mouth 2 (two) times daily. For congestion, Disp: 16 tablet, Rfl: 0   sucralfate (CARAFATE) 1 g tablet, TAKE ONE TABLET BY MOUTH THREE TIMES DAILY AS NEEDED., Disp: 42 tablet, Rfl: 0 Social History   Socioeconomic History   Marital status: Single    Spouse name: Not on file   Number of children: Not on file   Years of education: Not on file   Highest education level: Not on file  Occupational History   Not on file  Tobacco Use   Smoking status: Every Day    Current packs/day: 0.20    Average packs/day: 0.2 packs/day for 15.0 years (3.0 ttl pk-yrs)    Types: Cigarettes   Smokeless tobacco: Never  Vaping Use   Vaping status: Never Used  Substance and Sexual Activity   Alcohol use: Not Currently    Comment: Rarely   Drug use: Never   Sexual activity: Yes    Birth control/protection: I.U.D.  Other Topics Concern   Not on file  Social History Narrative   Not on file   Social Drivers of Health   Financial Resource Strain: Not on file  Food Insecurity: No Food Insecurity (06/10/2022)   Received from [facility], [facility]   Hunger Vital Sign    Worried About Running Out of Food in the Last Year: Never true    Ran Out of Food in the Last Year: Never true  Transportation Needs: Not on file  Physical Activity: Not on file  Stress: Not on file  Social Connections: Unknown (12/16/2021)   Received from [facility], [facility]   Social Network     Social Network: Not on file  Intimate Partner Violence: Unknown (11/07/2021)   Received from [facility], [facility]   HITS    Physically Hurt: Not on file    Insult or Talk Down To: Not on file    Threaten Physical Harm: Not on file    Scream or Curse: Not on file   Family History  Problem Relation Age of Onset   Breast cancer Mother    Cancer Mother    COPD Mother    Pancreatic cancer Father    COPD Father    Cancer Maternal Grandmother    COPD Maternal Grandmother  Colon cancer Neg Hx    Esophageal cancer Neg Hx    Rectal cancer Neg Hx    Stomach cancer Neg Hx     Objective: Office vital signs reviewed. BP 120/76   Pulse 84   Temp 98.3 F (36.8 C)   Ht 5\' 4"  (1.626 m)   Wt 233 lb (105.7 kg)   SpO2 97%   BMI 39.99 kg/m   Physical Examination:  General: Awake, alert, morbidly obese, No acute distress HEENT: No exophthalmos.  No goiter Cardio: regular rate and rhythm, S1S2 heard, no murmurs appreciated Pulm: clear to auscultation bilaterally, no wheezes, rhonchi or rales; normal work of breathing on room air     09/22/2023    8:34 AM 05/24/2023    9:14 AM 11/04/2022    2:27 PM  Depression screen PHQ 2/9  Decreased Interest 1 1 1   Down, Depressed, Hopeless 1 1 1   PHQ - 2 Score 2 2 2   Altered sleeping 1 2 1   Tired, decreased energy 2 2 2   Change in appetite 1 3 1   Feeling bad or failure about yourself  1 1 1   Trouble concentrating 0 0 0  Moving slowly or fidgety/restless 0 0 0  Suicidal thoughts 0 0 0  PHQ-9 Score 7 10 7   Difficult doing work/chores Somewhat difficult Somewhat difficult Somewhat difficult      09/22/2023    8:34 AM 05/24/2023    9:14 AM 11/04/2022    2:22 PM 08/04/2022    4:18 PM  GAD 7 : Generalized Anxiety Score  Nervous, Anxious, on Edge 1 1 1  0  Control/stop worrying 1 2 1 3   Worry too much - different things 0 0 1 3  Trouble relaxing 0 1 1 3   Restless 0 0 0 0  Easily annoyed or irritable 1 1 1 1   Afraid - awful might happen 0  1 1 3   Total GAD 7 Score 3 6 6 13   Anxiety Difficulty Somewhat difficult  Somewhat difficult Somewhat difficult   Assessment/ Plan: 53 y.o. female   Attention deficit hyperactivity disorder (ADHD), predominantly inattentive type - Plan: amphetamine-dextroamphetamine (ADDERALL) 10 MG tablet, amphetamine-dextroamphetamine (ADDERALL) 10 MG tablet, amphetamine-dextroamphetamine (ADDERALL) 10 MG tablet, ToxASSURE Select 13 (MW), Urine  Situational stress - Plan: sertraline (ZOLOFT) 50 MG tablet, buPROPion (WELLBUTRIN XL) 150 MG 24 hr tablet  Reactive depression - Plan: sertraline (ZOLOFT) 50 MG tablet, buPROPion (WELLBUTRIN XL) 150 MG 24 hr tablet  Closed fracture of tooth, initial encounter - Plan: amoxicillin (AMOXIL) 875 MG tablet, naproxen (EC-NAPROSYN) 500 MG EC tablet  Hypothyroidism due to Hashimoto's thyroiditis - Plan: TSH + free T4  ADHD is chronic and stable.  UDS and CSC were updated as per office policy.  The national narcotic database reviewed and there were no red flags  I have started her on Zoloft which is much shorter acting than the Prozac.  I hope that she will get less overlap and perhaps less symptomology.  Hopefully we will see an improvement in her mood with this as her ADHD could certainly be exacerbated by uncontrolled depression.  She will contact me by MyChart in the next 4 to 6 weeks, sooner if concerns arise  Amoxicillin and Naprosyn sent for tooth.  She actually got a phone call after our visit that she has an appointment with the dentist today so hopefully she can have this extracted  Check thyroid levels given reports of depression   Julie Rosander Hulen Skains, Alvarado [facility] (  [phone]  

## 2023-09-23 ENCOUNTER — Encounter: Payer: Self-pay | Admitting: Family Medicine

## 2023-09-23 LAB — TSH+FREE T4
Free T4: 1.44 ng/dL (ref 0.82–1.77)
TSH: 2.06 u[IU]/mL (ref 0.450–4.500)

## 2023-09-25 LAB — TOXASSURE SELECT 13 (MW), URINE

## 2023-10-02 DIAGNOSIS — Z419 Encounter for procedure for purposes other than remedying health state, unspecified: Secondary | ICD-10-CM | POA: Diagnosis not present

## 2023-10-05 ENCOUNTER — Ambulatory Visit: Payer: Medicaid Other | Admitting: Family Medicine

## 2023-10-06 ENCOUNTER — Encounter: Payer: Self-pay | Admitting: Family Medicine

## 2023-10-06 DIAGNOSIS — S025XXA Fracture of tooth (traumatic), initial encounter for closed fracture: Secondary | ICD-10-CM

## 2023-10-06 MED ORDER — AMOXICILLIN-POT CLAVULANATE 875-125 MG PO TABS
1.0000 | ORAL_TABLET | Freq: Two times a day (BID) | ORAL | 0 refills | Status: DC
Start: 2023-10-06 — End: 2023-12-06

## 2023-10-16 ENCOUNTER — Ambulatory Visit (HOSPITAL_COMMUNITY)
Admission: EM | Admit: 2023-10-16 | Discharge: 2023-10-16 | Disposition: A | Discharge status: Home / Self Care | Attending: Psychiatry | Admitting: Psychiatry

## 2023-10-16 DIAGNOSIS — F331 Major depressive disorder, recurrent, moderate: Secondary | ICD-10-CM | POA: Insufficient documentation

## 2023-10-16 DIAGNOSIS — Z5986 Financial insecurity: Secondary | ICD-10-CM | POA: Insufficient documentation

## 2023-10-16 DIAGNOSIS — Z636 Dependent relative needing care at home: Secondary | ICD-10-CM | POA: Insufficient documentation

## 2023-10-16 NOTE — Progress Notes (Signed)
   10/16/23 1412  [facility] Triage Screening (Walk-ins at [facility] only)  How Did You Hear About [place]? Family/Friend  What Is the Reason for Your Visit/Call Today? Cynthie is a 53 year old female presenting to [facility] accompanied by her son. Pt reports she is experiencing care giver burn out since June. Pt reports she is not able to do the things she needs to do because of the burnout she is facing. Pt reports that she wants to run away from home because she is very frustrated with her living situation/job. Pt denies substance use, Si, Hi and Avh.  How Long Has This Been Causing You Problems? 1-6 months  Have You Recently Had Any Thoughts About Hurting Yourself? No  Are You Planning to Commit Suicide/Harm Yourself At This time? No  Have you Recently Had Thoughts About Hurting Someone Karolee Ohs? No  Are You Planning To Harm Someone At This Time? No  Physical Abuse Denies  Verbal Abuse Denies  Sexual Abuse Denies  Exploitation of patient/patient's resources Denies  Self-Neglect Denies  Possible abuse reported to: Other (Comment)  Are you currently experiencing any auditory, visual or other hallucinations? No  Have You Used Any Alcohol or Drugs in the Past 24 Hours? No  Do you have any current medical co-morbidities that require immediate attention? No  Clinician description of patient physical appearance/behavior: anxious, cooperative  What Do You Feel Would Help You the Most Today? Social Support;Stress Management  If access to [facility] was not available, would you have sought care in the Emergency Department? No  Determination of Need Routine (7 days)  Options For Referral Intensive Outpatient Therapy

## 2023-10-16 NOTE — ED Notes (Signed)
 Patient Is discharging at this time. Printed AVS reviewed with patient by provider along with follow up appointments and resources. Valuables/belongings returned to patient.

## 2023-10-16 NOTE — Discharge Instructions (Signed)
  Discharge recommendations:   Medications: No new medications started at this visit. Patient is to take medications as prescribed. The patient or patient's guardian is to contact a medical professional and/or outpatient provider to address any new side effects that develop. The patient or the patient's guardian should update outpatient providers of any new medications and/or medication changes.   Outpatient Follow up: Partial Hospitalization Program will be in contact with you for follow up. Please review list of outpatient resources for psychiatry and counseling. Please follow up with your primary care provider for all medical related needs.   Therapy: We recommend that patient participate in individual therapy to address mental health concerns.  Safety:   The following safety precautions should be taken:   No sharp objects. This includes scissors, razors, scrapers, and putty knives.   Chemicals should be removed and locked up.   Medications should be removed and locked up.   Weapons should be removed and locked up. This includes firearms, knives and instruments that can be used to cause injury.   The patient should abstain from use of illicit substances/drugs and abuse of any medications.  If symptoms worsen or do not continue to improve or if the patient becomes actively suicidal or homicidal then it is recommended that the patient return to the closest hospital emergency department, the [facility], or call 911 for further evaluation and treatment. National Suicide Prevention Lifeline 1-800-SUICIDE or (216) 439-0998.  About 988 988 offers 24/7 access to trained crisis counselors who can help people experiencing mental health-related distress. People can call or text 988 or chat 988lifeline.org for themselves or if they are worried about a loved one who may need crisis support.

## 2023-10-16 NOTE — ED Provider Notes (Signed)
 Behavioral Health Urgent Care Medical Screening Exam  Patient Name: Julie Alvarado MRN: [id number] Date of Evaluation: 10/16/23 Chief Complaint: "I have caregiver stress"  Diagnosis:  Final diagnoses:  Caregiver stress  Moderate episode of recurrent major depressive disorder (HCC)    History of Present illness: Julie Alvarado 53 y.o., female patient presented to [facility] as a voluntary walk in accompanied by her son with complaints of feeling very stressed as the caregiver of her father with dementia and having a lack of support. PPH pf MDD, GAD and ADHD. Julie Alvarado, is seen face to face by this provider and chart reviewed on 10/16/23.    On evaluation Julie Alvarado reports that she has been taking care of her father for the past 3 yrs. He has an extensive medical hx made up of dementia,  pancreatic cancer with Whipple surgery, stroke, open heart surgery, COPD and has a feeding tube. She reports that none of her family is supportive or helps her in any way. She is also struggling financially because she is unable to work. She reports struggling to find the motivation to care for herself such as showering once a week, not wanting to cook or perform self care.  She reports currently being prescribed Wellbutrin and Adderall by her PCP. She does not currently have outpatient psychiatry services.  We discussed the PHP program and she was very interested in this.   During evaluation Julie Alvarado is sitting up in no acute distress.  She is alert & oriented x 4, calm, cooperative and attentive for this assessment.  Her mood is depressed with congruent affect. She has normal speech, and behavior.  Objectively there is no evidence of psychosis/mania or delusional thinking. Pt does not appear to be responding to internal or external stimuli.  Patient is able to converse coherently, goal directed thoughts, no distractibility, or pre-occupation.  She also denies suicidal/self-harm/homicidal ideation, psychosis, and  paranoia.  Patient answered question appropriately.    Flowsheet Row ED from 10/16/2023 in [facility] ED from 05/04/2023 in [facility] Emergency Department at [facility] ED from 04/29/2023 in [facility] [place]  C-SSRS RISK CATEGORY No Risk No Risk No Risk       Psychiatric Specialty Exam  Presentation  General Appearance:Appropriate for Environment  Eye Contact:Good  Speech:Clear and Coherent  Speech Volume:Normal  Handedness:Right   Mood and Affect  Mood: Depressed  Affect: Depressed; Congruent   Thought Process  Thought Processes: Coherent; Linear  Descriptions of Associations:Circumstantial  Orientation:Full (Time, Place and Person)  Thought Content:WDL    Hallucinations:None  Ideas of Reference:None  Suicidal Thoughts:No  Homicidal Thoughts:No   Sensorium  Memory: Immediate Good; Recent Good  Judgment: Good  Insight: Good   Executive Functions  Concentration: Fair  Attention Span: Fair  Recall: Good  Fund of Knowledge: Good  Language: Good   Psychomotor Activity  Psychomotor Activity: Normal   Assets  Assets: Desire for Improvement; Housing; Physical Health; Vocational/Educational; Communication Skills; Financial Resources/Insurance; Resilience   Sleep  Sleep: Fair  Number of hours:  6   Physical Exam: Physical Exam Vitals and nursing note reviewed.  HENT:     Head: Normocephalic.     Nose: Nose normal.  Eyes:     Extraocular Movements: Extraocular movements intact.  Cardiovascular:     Rate and Rhythm: Normal rate.  Pulmonary:     Effort: Pulmonary effort is normal.  Musculoskeletal:        General: Normal  range of motion.     Cervical back: Normal range of motion.  Neurological:     General: No focal deficit present.     Mental Status: She is alert.    Review of Systems  Constitutional: Negative.   HENT: Negative.    Eyes: Negative.    Respiratory: Negative.    Cardiovascular: Negative.   Gastrointestinal: Negative.   Genitourinary: Negative.   Musculoskeletal: Negative.   Neurological: Negative.   Endo/Heme/Allergies: Negative.   Psychiatric/Behavioral:  Positive for depression. The patient is nervous/anxious.    Blood pressure 134/79, pulse 77, resp. rate 19, SpO2 99%. There is no height or weight on file to calculate BMI.  Musculoskeletal: Strength & Muscle Tone: within normal limits Gait & Station: normal Patient leans: N/A   [facility] MSE Discharge Disposition for Follow up and Recommendations: Based on my evaluation the patient does not appear to have an emergency medical condition and can be discharged with resources and follow up care in outpatient services for Partial Hospitalization Program Pt is discharged with outpatient resources and referral to PHP.    Howie Ill, NP 10/16/2023, 6:36 PM

## 2023-11-01 NOTE — Patient Outreach (Signed)
 Error in opening encounter   Erice Ahles L. Noelle Penner, RN, BSN, CCM [facility] RN Care Manager [phone]

## 2023-11-13 DIAGNOSIS — Z419 Encounter for procedure for purposes other than remedying health state, unspecified: Secondary | ICD-10-CM | POA: Diagnosis not present

## 2023-12-06 ENCOUNTER — Ambulatory Visit: Admitting: Nurse Practitioner

## 2023-12-06 ENCOUNTER — Ambulatory Visit: Payer: Self-pay

## 2023-12-06 ENCOUNTER — Telehealth: Payer: Self-pay | Admitting: Family Medicine

## 2023-12-06 ENCOUNTER — Encounter: Payer: Self-pay | Admitting: Nurse Practitioner

## 2023-12-06 VITALS — BP 119/74 | HR 75 | Temp 98.1°F | Ht 64.0 in | Wt 230.6 lb

## 2023-12-06 DIAGNOSIS — M542 Cervicalgia: Secondary | ICD-10-CM | POA: Insufficient documentation

## 2023-12-06 DIAGNOSIS — K047 Periapical abscess without sinus: Secondary | ICD-10-CM | POA: Insufficient documentation

## 2023-12-06 MED ORDER — NAPROXEN 500 MG PO TBEC
500.0000 mg | DELAYED_RELEASE_TABLET | Freq: Two times a day (BID) | ORAL | 0 refills | Status: DC | PRN
Start: 2023-12-06 — End: 2023-12-08

## 2023-12-06 MED ORDER — AMOXICILLIN 875 MG PO TABS
875.0000 mg | ORAL_TABLET | Freq: Two times a day (BID) | ORAL | 0 refills | Status: DC
Start: 2023-12-06 — End: 2024-03-23

## 2023-12-06 NOTE — Progress Notes (Signed)
 Acute Office Visit  Subjective:     Patient ID: Julie Alvarado, female    DOB: Dec 02, 1970, 53 y.o.   MRN: [id number]  Chief Complaint  Patient presents with   Neck Pain    Neck pain started 3 days ago, unsure if injured neck woke up and it was hurting. Turning to right hurts worse than left but pain is located on left side of neck    HPI Julie Alvarado Neck Pain: Paitent complains of neck pain. Event that precipitate these symptoms: none known. Onset of symptoms 4 days ago, gradually worsening since that time. Current symptoms are pain in neck (aching and stabbing in character; 6/10 in severity). Patient denies numbness and paresthesias . Patient has had no prior neck problems.  Previous treatments include: none. "She is on Gabapentin  for chronic pain"  Active Ambulatory Problems    Diagnosis Date Noted   Hypothyroidism due to Hashimoto's thyroiditis 08/29/2018   Attention deficit hyperactivity disorder (ADHD) 08/29/2018   Tobacco use disorder 08/29/2018   Gastroesophageal reflux disease 12/05/2018   Non-seasonal allergic rhinitis 08/08/2019   Neck pain on left side 12/06/2023   Tooth abscess 12/06/2023   Resolved Ambulatory Problems    Diagnosis Date Noted   No Resolved Ambulatory Problems   Past Medical History:  Diagnosis Date   Allergy    Anxiety    Asthma    Blood transfusion without reported diagnosis    Depression    GERD (gastroesophageal reflux disease)    Thyroid  disease     Review of Systems  Constitutional:  Negative for fever.  HENT:  Negative for congestion and sore throat.        Tooth abscess left  abcess  Respiratory:  Negative for cough.   Cardiovascular:  Negative for chest pain and leg swelling.  Gastrointestinal:  Negative for blood in stool, diarrhea, nausea and vomiting.  Skin:  Negative for itching and rash.  Neurological:  Negative for dizziness and headaches.   Negative unless indicated in HPI    Objective:    BP 119/74   Pulse 75    Temp 98.1 F (36.7 C) (Temporal)   Ht 5\' 4"  (1.626 m)   Wt 230 lb 9.6 oz (104.6 kg)   SpO2 97%   BMI 39.58 kg/m  BP Readings from Last 3 Encounters:  12/06/23 119/74  09/22/23 120/76  05/24/23 114/70   Wt Readings from Last 3 Encounters:  12/06/23 230 lb 9.6 oz (104.6 kg)  09/22/23 233 lb (105.7 kg)  05/24/23 226 lb 12.8 oz (102.9 kg)      Physical Exam Vitals and nursing note reviewed.  Constitutional:      Appearance: She is obese.  HENT:     Head: Normocephalic and atraumatic.     Right Ear: Hearing, tympanic membrane, ear canal and external ear normal.     Left Ear: Hearing, tympanic membrane, ear canal and external ear normal.     Nose: Nose normal.     Mouth/Throat:     Lips: Pink.     Dentition: Dental abscesses present.   Eyes:     General: No scleral icterus.    Extraocular Movements: Extraocular movements intact.     Conjunctiva/sclera: Conjunctivae normal.     Pupils: Pupils are equal, round, and reactive to light.  Neck:     Thyroid : No thyroid  mass.  Cardiovascular:     Heart sounds: Normal heart sounds.  Pulmonary:     Effort: Pulmonary effort is normal.  Breath sounds: Normal breath sounds.  Musculoskeletal:     Cervical back: No torticollis. Pain with movement present. Decreased range of motion.  Lymphadenopathy:     Cervical: Cervical adenopathy present.  Skin:    General: Skin is warm and dry.  Neurological:     Mental Status: She is oriented to person, place, and time. Mental status is at baseline.  Psychiatric:        Mood and Affect: Mood normal.        Behavior: Behavior normal.        Thought Content: Thought content normal.        Judgment: Judgment normal.     No results found for any visits on 12/06/23.      Assessment & Plan:  Neck pain on left side -     Naproxen ; Take 1 tablet (500 mg total) by mouth 2 (two) times daily as needed (dental pain).  Dispense: 20 tablet; Refill: 0  Tooth abscess -     Amoxicillin ; Take 1  tablet (875 mg total) by mouth 2 (two) times daily.  Dispense: 14 tablet; Refill: 0  Julie Alvarado is a 53 yrs old caucasian female seen today for Neck pain /tooth abscess -Tooth abscess:amoxicillin  875 bid for 10 days, she is instructed to take all of it until done - neck pain: naproxen  500 mg BID PRN for pain - advise on daily oral hygiene Continue all previously prescribed medications  Encourage healthy lifestyle choices, including diet (rich in fruits, vegetables, and lean proteins, and low in salt and simple carbohydrates) and exercise (at least 30 minutes of moderate physical activity daily).     The above assessment and management plan was discussed with the patient. The patient verbalized understanding of and has agreed to the management plan. Patient is aware to call the clinic if they develop any new symptoms or if symptoms persist or worsen. Patient is aware when to return to the clinic for a follow-up visit. Patient educated on when it is appropriate to go to the emergency department.  Return if symptoms worsen or fail to improve.  Julie Oconnor [place] Thompson, DNP [facility] [address] [place], [place] [address] [phone]  Note: This document was prepared by [employer] voice dictation technology and any errors that results from this process are unintentional.

## 2023-12-06 NOTE — Telephone Encounter (Signed)
 E2C2 scheduled appointment.

## 2023-12-06 NOTE — Telephone Encounter (Signed)
 Copied from CRM [id number]. Topic: Clinical - Prescription Issue >> Dec 06, 2023  4:19 PM Retta Caster wrote: Reason for CRM: naproxen  (EC-NAPROSYN ) 500 MG EC tablet-Sarah from the pharmacy calling due to the this medication out of stock ,is it ok for them to do it as immediate release. Needs call back ASAP due to patient is at he pharmacy. [phone]

## 2023-12-06 NOTE — Telephone Encounter (Signed)
 This encounter was created in error - please disregard.

## 2023-12-06 NOTE — Telephone Encounter (Signed)
  Chief Complaint: Neck Pain Symptoms: left sided neck pain with headache, swelling behind ears on the left side Frequency: started a couple of days ago Pertinent Negatives: Patient denies CP, SOB, fever Disposition: [] ED /[] Urgent Care (no appt availability in office) / [x] Appointment(In office/virtual)/ []  [facility]/ [] Home Care/ [] Refused Recommended Disposition /[] [place]/ []  Follow-up with PCP Additional Notes: patient called with left sided neck pain with headache and swelling behind ears on the left side. Patient endorses symptoms started a couple of days ago. Denies CP, SOB and fever. Patient is recommended to be seen today. Patient scheduled for today, 12/06/2023 at 2:35 PM. Patient verbalized understanding and all questions answered.     Reason for Disposition  [1] SEVERE neck pain (e.g., excruciating, unable to do any normal activities) AND [2] not improved after 2 hours of pain medicine  Answer Assessment - Initial Assessment Questions 1. ONSET: "When did the pain begin?"      Started a couple of days ago 2. LOCATION: "Where does it hurt?"      Left sided neck pain 3. PATTERN "Does the pain come and go, or has it been constant since it started?"      constant 4. SEVERITY: "How bad is the pain?"  (Scale 1-10; or mild, moderate, severe)   - NO PAIN (0): no pain or only slight stiffness    - MILD (1-3): doesn't interfere with normal activities    - MODERATE (4-7): interferes with normal activities or awakens from sleep    - SEVERE (8-10):  excruciating pain, unable to do any normal activities      Mild to Moderate 5. RADIATION: "Does the pain go anywhere else, shoot into your arms?"     no 6. CORD SYMPTOMS: "Any weakness or numbness of the arms or legs?"     no 7. CAUSE: "What do you think is causing the neck pain?"     unsure 8. NECK OVERUSE: "Any recent activities that involved turning or twisting the neck?"     no 9. OTHER SYMPTOMS: "Do you  have any other symptoms?" (e.g., headache, fever, chest pain, difficulty breathing, neck swelling)     Neck swelling to left side  Protocols used: Neck Pain or Stiffness-A-AH

## 2023-12-08 MED ORDER — NAPROXEN 500 MG PO TABS: 500.0000 mg | ORAL_TABLET | Freq: Two times a day (BID) | ORAL | 1 refills | Status: DC | PRN

## 2023-12-08 NOTE — Addendum Note (Signed)
 Addended by: Francine Iron on: 12/08/2023 01:38 PM   Modules accepted: Orders

## 2023-12-08 NOTE — Telephone Encounter (Signed)
 Refills sent to [facility]. Pt aware.

## 2023-12-08 NOTE — Telephone Encounter (Signed)
 Yes that is fine to go ahead and give the patient the same dosing of the immediate release naproxen  as she has currently.  Give her 1 months worth with no refills

## 2023-12-13 DIAGNOSIS — Z419 Encounter for procedure for purposes other than remedying health state, unspecified: Secondary | ICD-10-CM | POA: Diagnosis not present

## 2023-12-22 ENCOUNTER — Encounter: Payer: Self-pay | Admitting: Family Medicine

## 2023-12-22 ENCOUNTER — Ambulatory Visit: Payer: Medicaid Other | Admitting: Family Medicine

## 2023-12-22 VITALS — BP 108/60 | HR 78 | Temp 97.9°F | Ht 64.0 in | Wt 232.6 lb

## 2023-12-22 DIAGNOSIS — F900 Attention-deficit hyperactivity disorder, predominantly inattentive type: Secondary | ICD-10-CM | POA: Diagnosis not present

## 2023-12-22 DIAGNOSIS — Z23 Encounter for immunization: Secondary | ICD-10-CM | POA: Diagnosis not present

## 2023-12-22 DIAGNOSIS — F439 Reaction to severe stress, unspecified: Secondary | ICD-10-CM

## 2023-12-22 DIAGNOSIS — Z1211 Encounter for screening for malignant neoplasm of colon: Secondary | ICD-10-CM | POA: Diagnosis not present

## 2023-12-22 MED ORDER — AMPHETAMINE-DEXTROAMPHETAMINE 10 MG PO TABS
ORAL_TABLET | ORAL | 0 refills | Status: DC
Start: 2024-02-03 — End: 2024-03-23

## 2023-12-22 MED ORDER — AMPHETAMINE-DEXTROAMPHETAMINE 10 MG PO TABS
ORAL_TABLET | ORAL | 0 refills | Status: DC
Start: 2024-01-05 — End: 2024-03-23

## 2023-12-22 MED ORDER — AMPHETAMINE-DEXTROAMPHETAMINE 10 MG PO TABS
ORAL_TABLET | ORAL | 0 refills | Status: DC
Start: 2024-03-05 — End: 2024-03-23

## 2023-12-22 NOTE — Progress Notes (Signed)
 Subjective: CC: ADHD follow-up PCP: Eliodoro Guerin, DO EAV:WUJW Moorehouse is a 53 y.o. female presenting to clinic today for:  1.  ADHD follow-up/stress Patient reports is a stable issue.  She has been having some increased stress and anxiety surrounding the care of her father, who is totally dependent on her for care.  She also has quite a bit of stress surrounding her son, who has behavioral and mental health issues.  He refuses to seek care.  She notes he starts fires.  He is not going to school consistently and is obsessed with gaming.  She sought care in the behavioral health ED and they recommended that she start counseling services.  She has not yet heard from these services but is interested in still pursuing them   ROS: Per HPI  No Known Allergies Past Medical History:  Diagnosis Date   Allergy    Anxiety    Asthma    Blood transfusion without reported diagnosis    Depression    GERD (gastroesophageal reflux disease)    Thyroid  disease     Current Outpatient Medications:    amoxicillin  (AMOXIL ) 875 MG tablet, Take 1 tablet (875 mg total) by mouth 2 (two) times daily., Disp: 14 tablet, Rfl: 0   amphetamine -dextroamphetamine  (ADDERALL) 10 MG tablet, Take 20mg  each morning and 10mg  each afternoon, Disp: 90 tablet, Rfl: 0   amphetamine -dextroamphetamine  (ADDERALL) 10 MG tablet, Take 20mg  each morning and 10mg  each afternoon, Disp: 90 tablet, Rfl: 0   amphetamine -dextroamphetamine  (ADDERALL) 10 MG tablet, Take 20mg  each morning and 10mg  each afternoon, Disp: 90 tablet, Rfl: 0   b complex vitamins capsule, Take 1 capsule by mouth daily., Disp: , Rfl:    buPROPion  (WELLBUTRIN  XL) 150 MG 24 hr tablet, Take 1 tablet (150 mg total) by mouth daily. Dose reduction, Disp: 90 tablet, Rfl: 3   famotidine (PEPCID) 20 MG tablet, Take 20 mg by mouth daily., Disp: , Rfl:    fexofenadine (ALLEGRA) 180 MG tablet, Take 180 mg by mouth daily., Disp: , Rfl:    FISH OIL-KRILL OIL PO, Take by  mouth., Disp: , Rfl:    fluticasone  (FLONASE ) 50 MCG/ACT nasal spray, Place 2 sprays into both nostrils daily., Disp: 16 g, Rfl: 6   gabapentin (NEURONTIN) 300 MG capsule, Take 300 mg by mouth 3 (three) times daily., Disp: , Rfl:    hydrOXYzine  (VISTARIL ) 25 MG capsule, Take 1 capsule (25 mg total) by mouth every 8 (eight) hours as needed for itching (causes sleepiness)., Disp: 30 capsule, Rfl: 0   levothyroxine  (SYNTHROID ) 200 MCG tablet, Take 1 tablet (200 mcg total) by mouth daily before breakfast., Disp: 30 tablet, Rfl: 10   Multiple Vitamin (MULTIVITAMIN) tablet, Take 1 tablet by mouth daily., Disp: , Rfl:    naproxen  (NAPROSYN ) 500 MG tablet, Take 1 tablet (500 mg total) by mouth 2 (two) times daily as needed., Disp: 60 tablet, Rfl: 1   ondansetron  (ZOFRAN -ODT) 4 MG disintegrating tablet, Take 1 tablet (4 mg total) by mouth every 8 (eight) hours as needed for nausea or vomiting., Disp: 20 tablet, Rfl: 0   OVER THE COUNTER MEDICATION, Vitamin D  3 2000 IU one capsule daily., Disp: , Rfl:    sertraline  (ZOLOFT ) 50 MG tablet, Take 1 tablet (50 mg total) by mouth daily., Disp: 90 tablet, Rfl: 0 Social History   Socioeconomic History   Marital status: Single    Spouse name: Not on file   Number of children: Not on file  Years of education: Not on file   Highest education level: Not on file  Occupational History   Not on file  Tobacco Use   Smoking status: Every Day    Current packs/day: 0.20    Average packs/day: 0.2 packs/day for 15.0 years (3.0 ttl pk-yrs)    Types: Cigarettes   Smokeless tobacco: Never  Vaping Use   Vaping status: Never Used  Substance and Sexual Activity   Alcohol use: Not Currently    Comment: Rarely   Drug use: Never   Sexual activity: Yes    Birth control/protection: I.U.D.  Other Topics Concern   Not on file  Social History Narrative   Not on file   Social Drivers of Health   Financial Resource Strain: Not on file  Food Insecurity: No Food  Insecurity (06/10/2022)   Received from [facility], [facility]   Hunger Vital Sign    Worried About Running Out of Food in the Last Year: Never true    Ran Out of Food in the Last Year: Never true  Transportation Needs: Not on file  Physical Activity: Not on file  Stress: Not on file  Social Connections: Unknown (12/16/2021)   Received from [facility], [facility]   Social Network    Social Network: Not on file  Intimate Partner Violence: Unknown (11/07/2021)   Received from [facility], [facility]   HITS    Physically Hurt: Not on file    Insult or Talk Down To: Not on file    Threaten Physical Harm: Not on file    Scream or Curse: Not on file   Family History  Problem Relation Age of Onset   Breast cancer Mother    Cancer Mother    COPD Mother    Pancreatic cancer Father    COPD Father    Cancer Maternal Grandmother    COPD Maternal Grandmother    Colon cancer Neg Hx    Esophageal cancer Neg Hx    Rectal cancer Neg Hx    Stomach cancer Neg Hx     Objective: Office vital signs reviewed. There were no vitals taken for this visit.  Physical Examination:  General: Awake, alert, nontoxic female, No acute distress HEENT: sclera white, MMM Cardio: regular rate and rhythm, S1S2 heard, no murmurs appreciated Pulm: clear to auscultation bilaterally, no wheezes, rhonchi or rales; normal work of breathing on room air       12/22/2023    3:53 PM 09/22/2023    8:34 AM 05/24/2023    9:14 AM  Depression screen PHQ 2/9  Decreased Interest 2 1 1   Down, Depressed, Hopeless 2 1 1   PHQ - 2 Score 4 2 2   Altered sleeping 3 1 2   Tired, decreased energy 3 2 2   Change in appetite 3 1 3   Feeling bad or failure about yourself  1 1 1   Trouble concentrating 0 0 0  Moving slowly or fidgety/restless 0 0 0  Suicidal thoughts 0 0 0  PHQ-9 Score 14 7 10   Difficult doing work/chores Very difficult Somewhat difficult Somewhat difficult      12/22/2023    3:53 PM 09/22/2023     8:34 AM 05/24/2023    9:14 AM 11/04/2022    2:22 PM  GAD 7 : Generalized Anxiety Score  Nervous, Anxious, on Edge 3 1 1 1   Control/stop worrying 3 1 2 1   Worry too much - different things 3 0 0 1  Trouble relaxing 3 0  1 1  Restless 0 0 0 0  Easily annoyed or irritable 1 1 1 1   Afraid - awful might happen 3 0 1 1  Total GAD 7 Score 16 3 6 6   Anxiety Difficulty Very difficult Somewhat difficult  Somewhat difficult     Assessment/ Plan: 53 y.o. female   Attention deficit hyperactivity disorder (ADHD), predominantly inattentive type - Plan: amphetamine -dextroamphetamine  (ADDERALL) 10 MG tablet, amphetamine -dextroamphetamine  (ADDERALL) 10 MG tablet, amphetamine -dextroamphetamine  (ADDERALL) 10 MG tablet  Colon cancer screening - Plan: Ambulatory referral to Gastroenterology  Need for pneumococcal vaccination - Plan: Pneumococcal conjugate vaccine 20-valent (Prevnar 20)  Situational stress  ADHD chronic and stable.  UDS and CSA are up-to-date.  National narcotic database reviewed and there were no red flags.  Postdated refill sent  Referral to gastroenterology for colon cancer screening.  Pneumococcal vaccination ordered  I have sent her via MyChart the information that was provided in the ED for the recommended counseling services.  Encouraged her to go ahead and set up a visit   Ger Ringenberg Bambi Bonine, DO [facility] [phone]

## 2023-12-28 ENCOUNTER — Other Ambulatory Visit: Payer: Self-pay | Admitting: Family Medicine

## 2023-12-28 DIAGNOSIS — Z1231 Encounter for screening mammogram for malignant neoplasm of breast: Secondary | ICD-10-CM

## 2024-01-03 ENCOUNTER — Other Ambulatory Visit: Payer: Self-pay | Admitting: Family Medicine

## 2024-01-03 DIAGNOSIS — F329 Major depressive disorder, single episode, unspecified: Secondary | ICD-10-CM

## 2024-01-03 DIAGNOSIS — F439 Reaction to severe stress, unspecified: Secondary | ICD-10-CM

## 2024-01-10 ENCOUNTER — Inpatient Hospital Stay: Admission: RE | Admit: 2024-01-10 | Source: Ambulatory Visit

## 2024-01-13 DIAGNOSIS — Z419 Encounter for procedure for purposes other than remedying health state, unspecified: Secondary | ICD-10-CM | POA: Diagnosis not present

## 2024-02-12 DIAGNOSIS — Z419 Encounter for procedure for purposes other than remedying health state, unspecified: Secondary | ICD-10-CM | POA: Diagnosis not present

## 2024-03-14 DIAGNOSIS — Z419 Encounter for procedure for purposes other than remedying health state, unspecified: Secondary | ICD-10-CM | POA: Diagnosis not present

## 2024-03-24 ENCOUNTER — Ambulatory Visit: Admitting: Family Medicine

## 2024-03-24 ENCOUNTER — Encounter: Payer: Self-pay | Admitting: Family Medicine

## 2024-03-24 VITALS — BP 130/87 | HR 79 | Temp 97.3°F | Ht 65.0 in | Wt 228.0 lb

## 2024-03-24 DIAGNOSIS — F439 Reaction to severe stress, unspecified: Secondary | ICD-10-CM | POA: Diagnosis not present

## 2024-03-24 DIAGNOSIS — F329 Major depressive disorder, single episode, unspecified: Secondary | ICD-10-CM

## 2024-03-24 DIAGNOSIS — R5382 Chronic fatigue, unspecified: Secondary | ICD-10-CM | POA: Diagnosis not present

## 2024-03-24 DIAGNOSIS — F900 Attention-deficit hyperactivity disorder, predominantly inattentive type: Secondary | ICD-10-CM

## 2024-03-24 DIAGNOSIS — K047 Periapical abscess without sinus: Secondary | ICD-10-CM

## 2024-03-24 MED ORDER — AMPHETAMINE-DEXTROAMPHETAMINE 10 MG PO TABS
ORAL_TABLET | ORAL | 0 refills | Status: DC
Start: 2024-06-03 — End: 2024-06-26

## 2024-03-24 MED ORDER — LEVOTHYROXINE SODIUM 200 MCG PO TABS: 200.0000 ug | ORAL_TABLET | Freq: Every day | ORAL | 3 refills | Status: AC

## 2024-03-24 MED ORDER — AMOXICILLIN-POT CLAVULANATE 875-125 MG PO TABS: 1.0000 | ORAL_TABLET | Freq: Two times a day (BID) | ORAL | 0 refills | Status: DC

## 2024-03-24 MED ORDER — AMPHETAMINE-DEXTROAMPHETAMINE 10 MG PO TABS
ORAL_TABLET | ORAL | 0 refills | Status: DC
Start: 2024-04-04 — End: 2024-05-09

## 2024-03-24 MED ORDER — AMPHETAMINE-DEXTROAMPHETAMINE 10 MG PO TABS
ORAL_TABLET | ORAL | 0 refills | Status: DC
Start: 2024-05-04 — End: 2024-06-26

## 2024-03-24 MED ORDER — SERTRALINE HCL 50 MG PO TABS: 50.0000 mg | ORAL_TABLET | Freq: Every day | ORAL | 3 refills | Status: AC

## 2024-03-24 NOTE — Progress Notes (Signed)
 Subjective: [name] f/i PCP: [name], DO YEP:Hpwj Picking is a 53 y.o. female presenting to clinic today for:  Discussed the use of AI scribe software for clinical note transcription with the patient, who gave verbal consent to proceed.  History of Present Illness   Julie Alvarado is a 53 year old female who presents with dental pain and infection.  She is experiencing significant dental pain due to a tooth she believes is infected again. Her previous dentist canceled the appointment to extract the tooth, leading her to seek a new dentist who does not accept Medicaid, requiring her to pay out of pocket. She has an appointment next week for the extraction.  The pain is severe, and during a previous extraction, even an extra dose of local anesthetic did not alleviate the pain. She manages the pain by brushing the affected tooth with salt and pressing it into the cavity. She also grinds her teeth at night, which she suspects might contribute to the pain. She is concerned about another tooth that feels cracked, although she does not want it extracted.  She continues to take Adderall for ADHD, noting improved adherence since helping a friend, Randine, with home repairs. She had previously spent several months inactive, sitting on the couch and playing games on her tablet, but is now more active.  Her brother recently visited for her father's 80th birthday, revealing he had been on probation for carrying medical marijuana across state lines, which he had not disclosed earlier due to embarrassment. This revelation has helped reconcile their relationship.     Continues to have fatigue. Depression as above. Wants hormone testing.  ROS: Per HPI  Allergies  Allergen Reactions   Shingrix  [Zoster Vac Recomb Adjuvanted] Swelling    Joint pain , body pain swelling , fatigue     Past Medical History:  Diagnosis Date   Allergy    Anxiety    Asthma    Blood transfusion without reported  diagnosis    Depression    GERD (gastroesophageal reflux disease)    Thyroid  disease     Current Outpatient Medications:    amoxicillin -clavulanate (AUGMENTIN ) 875-125 MG tablet, Take 1 tablet by mouth 2 (two) times daily., Disp: 20 tablet, Rfl: 0   b complex vitamins capsule, Take 1 capsule by mouth daily., Disp: , Rfl:    buPROPion  (WELLBUTRIN  XL) 150 MG 24 hr tablet, Take 1 tablet (150 mg total) by mouth daily. Dose reduction, Disp: 90 tablet, Rfl: 3   famotidine (PEPCID) 20 MG tablet, Take 20 mg by mouth daily., Disp: , Rfl:    fexofenadine (ALLEGRA) 180 MG tablet, Take 180 mg by mouth daily., Disp: , Rfl:    fluticasone  (FLONASE ) 50 MCG/ACT nasal spray, Place 2 sprays into both nostrils daily., Disp: 16 g, Rfl: 6   Multiple Vitamin (MULTIVITAMIN) tablet, Take 1 tablet by mouth daily., Disp: , Rfl:    OVER THE COUNTER MEDICATION, Vitamin D  3 2000 IU one capsule daily., Disp: , Rfl:    [START ON 04/04/2024] amphetamine -dextroamphetamine  (ADDERALL) 10 MG tablet, Take 20mg  each morning and 10mg  each afternoon, Disp: 90 tablet, Rfl: 0   [START ON 05/04/2024] amphetamine -dextroamphetamine  (ADDERALL) 10 MG tablet, Take 20mg  each morning and 10mg  each afternoon, Disp: 90 tablet, Rfl: 0   [START ON 06/03/2024] amphetamine -dextroamphetamine  (ADDERALL) 10 MG tablet, Take 20mg  each morning and 10mg  each afternoon, Disp: 90 tablet, Rfl: 0   FISH OIL-KRILL OIL PO, Take by mouth. (Patient not taking: Reported on 03/24/2024), Disp: ,  Rfl:    gabapentin (NEURONTIN) 300 MG capsule, Take 300 mg by mouth 3 (three) times daily. (Patient not taking: Reported on 03/24/2024), Disp: , Rfl:    hydrOXYzine  (VISTARIL ) 25 MG capsule, Take 1 capsule (25 mg total) by mouth every 8 (eight) hours as needed for itching (causes sleepiness). (Patient not taking: Reported on 03/24/2024), Disp: 30 capsule, Rfl: 0   levothyroxine  (SYNTHROID ) 200 MCG tablet, Take 1 tablet (200 mcg total) by mouth daily before breakfast., Disp: 90  tablet, Rfl: 3   naproxen  (NAPROSYN ) 500 MG tablet, Take 1 tablet (500 mg total) by mouth 2 (two) times daily as needed. (Patient not taking: Reported on 03/24/2024), Disp: 60 tablet, Rfl: 1   ondansetron  (ZOFRAN -ODT) 4 MG disintegrating tablet, Take 1 tablet (4 mg total) by mouth every 8 (eight) hours as needed for nausea or vomiting. (Patient not taking: Reported on 03/24/2024), Disp: 20 tablet, Rfl: 0   sertraline  (ZOLOFT ) 50 MG tablet, Take 1 tablet (50 mg total) by mouth daily., Disp: 90 tablet, Rfl: 3 Social History   Socioeconomic History   Marital status: Single    Spouse name: Not on file   Number of children: Not on file   Years of education: Not on file   Highest education level: Not on file  Occupational History   Not on file  Tobacco Use   Smoking status: Every Day    Current packs/day: 0.20    Average packs/day: 0.2 packs/day for 15.0 years (3.0 ttl pk-yrs)    Types: Cigarettes   Smokeless tobacco: Never  Vaping Use   Vaping status: Never Used  Substance and Sexual Activity   Alcohol use: Not Currently    Comment: Rarely   Drug use: Never   Sexual activity: Yes    Birth control/protection: I.U.D.  Other Topics Concern   Not on file  Social History Narrative   Not on file   Social Drivers of Health   Financial Resource Strain: Not on file  Food Insecurity: No Food Insecurity (06/10/2022)   [facility]   Hunger Vital Sign    Within the past 12 months, you worried that your food would run out before you got the money to buy more.: Never true    Within the past 12 months, the food you bought just didn't last and you didn't have money to get more.: Never true  Transportation Needs: Not on file  Physical Activity: Not on file  Stress: Not on file  Social Connections: Unknown (12/16/2021)   [facility]   Social Network    Social Network: Not on file  Intimate Partner Violence: Unknown (11/07/2021)   Received from [facility]   HITS     Physically Hurt: Not on file    Insult or Talk Down To: Not on file    Threaten Physical Harm: Not on file    Scream or Curse: Not on file   Family History  Problem Relation Age of Onset   Breast cancer Mother    Cancer Mother    COPD Mother    Pancreatic cancer Father    COPD Father    Cancer Maternal Grandmother    COPD Maternal Grandmother    Colon cancer Neg Hx    Esophageal cancer Neg Hx    Rectal cancer Neg Hx    Stomach cancer Neg Hx     Objective: Office vital signs reviewed. BP 130/87   Pulse 79   Temp (!) 97.3 F (36.3  C)   Ht 5' 5 (1.651 m)   Wt 228 lb (103.4 kg)   SpO2 99%   BMI 37.94 kg/m   Physical Examination:  General: Awake, alert, well nourished, No acute distress HEENT: sclera white, MMM.  Broken left lower molar with visible gingival inflammation but no purulence appreciated Cardio: regular rate and rhythm, S1S2 heard, no murmurs appreciated Pulm: clear to auscultation bilaterally, no wheezes, rhonchi or rales; normal work of breathing on room air     03/24/2024    2:53 PM 12/22/2023    3:53 PM 09/22/2023    8:34 AM  Depression screen PHQ 2/9  Decreased Interest 2 2 1   Down, Depressed, Hopeless 3 2 1   PHQ - 2 Score 5 4 2   Altered sleeping 3 3 1   Tired, decreased energy 3 3 2   Change in appetite 3 3 1   Feeling bad or failure about yourself  2 1 1   Trouble concentrating 1 0 0  Moving slowly or fidgety/restless 0 0 0  Suicidal thoughts 0 0 0  PHQ-9 Score 17 14 7   Difficult doing work/chores Very difficult Very difficult Somewhat difficult      03/24/2024    2:53 PM 12/22/2023    3:53 PM 09/22/2023    8:34 AM 05/24/2023    9:14 AM  GAD 7 : Generalized Anxiety Score  Nervous, Anxious, on Edge 3 3 1 1   Control/stop worrying 3 3 1 2   Worry too much - different things 3 3 0 0  Trouble relaxing 3 3 0 1  Restless 0 0 0 0  Easily annoyed or irritable 1 1 1 1   Afraid - awful might happen 3 3 0 1  Total GAD 7 Score 16 16 3 6   Anxiety  Difficulty Somewhat difficult Very difficult Somewhat difficult    Assessment/ Plan: 53 y.o. female   Dental infection - Plan: amoxicillin -clavulanate (AUGMENTIN ) 875-125 MG tablet  Attention deficit hyperactivity disorder (ADHD), predominantly inattentive type - Plan: amphetamine -dextroamphetamine  (ADDERALL) 10 MG tablet, amphetamine -dextroamphetamine  (ADDERALL) 10 MG tablet, amphetamine -dextroamphetamine  (ADDERALL) 10 MG tablet  Situational stress - Plan: sertraline  (ZOLOFT ) 50 MG tablet  Reactive depression - Plan: sertraline  (ZOLOFT ) 50 MG tablet  Chronic fatigue - Plan: FSH/LH, TSH + free T4, CMP14+EGFR      Dental infection (tooth abscess) Dental infection with abscess on lower jaw, inflamed without purulent discharge. Concerns about pain during extraction due to infection. Scheduled for extraction next week with a new dentist, paying out of pocket as the dentist is not in-network. - Prescribed empiric antibiotics to manage infection prior to extraction.  Attention-Deficit Hyperactivity Disorder (ADHD) Diagnosed with ADHD, currently taking Adderall. Improved adherence to medication regimen with increased activity.     Depression is relatively unchanged and i think that has a lot to do with her situation.  She is getting out of the house now and helping her friend which I think has really been helpful.  Chronic fatigue may be related to thyroid  versus uncontrolled depression but I am glad to look at [facility] to look for any evidence of menopausal processes  Abdulloh Ullom CHRISTELLA Fielding, DO [facility] [phone]

## 2024-03-25 LAB — FSH/LH
FSH: 39.3 m[IU]/mL
LH: 39.8 m[IU]/mL

## 2024-03-27 ENCOUNTER — Encounter: Payer: Self-pay | Admitting: Family Medicine

## 2024-03-27 ENCOUNTER — Ambulatory Visit: Payer: Self-pay | Admitting: Family Medicine

## 2024-03-27 DIAGNOSIS — R4189 Other symptoms and signs involving cognitive functions and awareness: Secondary | ICD-10-CM

## 2024-04-14 DIAGNOSIS — Z419 Encounter for procedure for purposes other than remedying health state, unspecified: Secondary | ICD-10-CM | POA: Diagnosis not present

## 2024-04-21 ENCOUNTER — Other Ambulatory Visit: Payer: Self-pay

## 2024-04-21 ENCOUNTER — Encounter (HOSPITAL_COMMUNITY): Payer: Self-pay

## 2024-04-21 ENCOUNTER — Ambulatory Visit: Payer: Self-pay

## 2024-04-21 ENCOUNTER — Emergency Department (HOSPITAL_COMMUNITY)
Admission: EM | Admit: 2024-04-21 | Discharge: 2024-04-21 | Disposition: A | Discharge status: Home / Self Care | Attending: Emergency Medicine | Admitting: Emergency Medicine

## 2024-04-21 DIAGNOSIS — K08409 Partial loss of teeth, unspecified cause, unspecified class: Secondary | ICD-10-CM | POA: Insufficient documentation

## 2024-04-21 DIAGNOSIS — K0889 Other specified disorders of teeth and supporting structures: Secondary | ICD-10-CM | POA: Insufficient documentation

## 2024-04-21 MED ORDER — ACETAMINOPHEN 500 MG PO TABS
1000.0000 mg | ORAL_TABLET | Freq: Once | ORAL | Status: AC
Start: 2024-04-21 — End: 2024-04-21
  Administered 2024-04-21: 1000 mg via ORAL
  Filled 2024-04-21: qty 2

## 2024-04-21 MED ORDER — TRAMADOL HCL 50 MG PO TABS: 50.0000 mg | ORAL_TABLET | Freq: Four times a day (QID) | ORAL | 0 refills | Status: DC | PRN

## 2024-04-21 NOTE — ED Notes (Signed)
 Pt/family received d/c paperwork at this time. After going over the paperwork any questions, comments, or concerns were answered to the best of this nurse's knowledge. The pt/family verbally acknowledged the teachings/instructions.

## 2024-04-21 NOTE — ED Triage Notes (Signed)
 Pt stated that she had a tooth extracted on the bottom left jaw but stated that the doc left some roots in place and she is now having severe pain.

## 2024-04-21 NOTE — Telephone Encounter (Signed)
 FYI Only or Action Required?: Action required by provider: patient instructed to call Dentist office but office is closed on Fridays-patient is unsure of the on call options. Patient is without a car currently-asking if PCP would be able to write strong pain medication.  Patient was last seen in primary care on 03/24/2024 by [name], DO.  Called Nurse Triage reporting Dental Pain.  Symptoms began today.  Interventions attempted: OTC medications: Tylenol  and Naproxen  (explained both Naproxen  and Toradol  are anti-inflammatories)-patient states that was the only option she was given for pain , Prescription medications: Toradol , and Rest, hydration, or home remedies.  Symptoms are: unchanged.  Triage Disposition: See Dentist Within 24 Hours  Patient/caregiver understands and will follow disposition?: Yes  Copied from CRM #[id number]. Topic: Clinical - Red Word Triage >> Apr 21, 2024 11:44 AM DeAngela L wrote: Red Word that prompted transfer to Nurse Triage: swollen knot on left side of her jaw this morning after having her to removed and the roots are still attached and the patient say the extreme pain is back today feels like on the 1st when the numbing meds wore off, and she can see something black like maybe a blood clot   Pt num [phone] (M) Reason for Disposition  [1] SEVERE pain (e.g., excruciating, pain scale 8-10) AND [2] begins or increases > 2 days (48 hours) after tooth was removed  Answer Assessment - Initial Assessment Questions Patient had a tooth extraction on 04/19/2024-patient has an area of swelling to left side of mouth. There were complication with the tooth extraction where the entire root wasn't able to be removed. Patient states she has been taking Toradol , Tylenol  and Naproxen . Patient was educated about not taking both Toradol  and Naproxen  together due to both being anti-inflammatories. Patient is asking for strong pain medication-patient is however without a  car currently as her car broke down on Wednesday after her extraction. Patient is recommended to contact dentist-patient did state dentist office is closed on Fridays. Asking for strong pain medication from PCP office. Requesting a call back if possible.   1. SYMPTOM: What's the main symptom you're concerned about? (e.g., bleeding, pain, fever)     Hard knot to the left side of jaw. Patient states the entire root wasn't able to be removed. Patient is have severe pain  2. ONSET: When did the hard knot and pain  start?     This morning 3. DATE of TOOTH EXTRACTION: When was the surgery performed?      04/19/2024 4. BLEEDING: Is there any bleeding? If Yes, ask: How much?      Slight bleeding 5. PAIN: Is there any pain? If Yes, ask: How bad is it?  (Scale 1-10; or mild, moderate, severe)     6 to 7 out of 10 6. FEVER: Do you have a fever? If Yes, ask: What is your temperature, how was it measured, and when did it start?     no 7. OTHER SYMPTOMS: Do you have any other symptoms? (e.g., face swelling)     Slight facial swelling  Protocols used: Tooth Extraction-A-AH

## 2024-04-21 NOTE — Discharge Instructions (Signed)
 Follow up as planned with oral surgeon. Take Tramadol  for pain and continue antibiotics as prescribed.

## 2024-04-21 NOTE — ED Provider Notes (Signed)
 [place] EMERGENCY DEPARTMENT AT [facility] Provider Note   CSN: [id number] Arrival date & time: 04/21/24  1811     Patient presents with: Dental Pain   Julie Alvarado is a 53 y.o. female.   Post-extraction pain in the left lower molars since yesterday, the extraction was 3 days ago. She is currently taking amoxicillin  and ibuprofen. No fever, facial swelling or difficulty swallowing.   The history is provided by the patient. No language interpreter was used.  Dental Pain      Prior to Admission medications   Medication Sig Start Date End Date Taking? Authorizing Provider  traMADol  (ULTRAM ) 50 MG tablet Take 1 tablet (50 mg total) by mouth every 6 (six) hours as needed. 04/21/24  Yes Odell Balls, PA-C  amoxicillin -clavulanate (AUGMENTIN ) 875-125 MG tablet Take 1 tablet by mouth 2 (two) times daily. 03/24/24   [name], DO  amphetamine -dextroamphetamine  (ADDERALL) 10 MG tablet Take 20mg  each morning and 10mg  each afternoon 04/04/24   Jolinda Norene M, DO  amphetamine -dextroamphetamine  (ADDERALL) 10 MG tablet Take 20mg  each morning and 10mg  each afternoon 05/04/24   Jolinda Norene M, DO  amphetamine -dextroamphetamine  (ADDERALL) 10 MG tablet Take 20mg  each morning and 10mg  each afternoon 06/03/24   Jolinda Norene M, DO  b complex vitamins capsule Take 1 capsule by mouth daily.    [provider]  buPROPion  (WELLBUTRIN  XL) 150 MG 24 hr tablet Take 1 tablet (150 mg total) by mouth daily. Dose reduction 09/22/23   Jolinda Norene M, DO  famotidine (PEPCID) 20 MG tablet Take 20 mg by mouth daily.    [provider]  fexofenadine (ALLEGRA) 180 MG tablet Take 180 mg by mouth daily.    [provider]  FISH OIL-KRILL OIL PO Take by mouth. Patient not taking: Reported on 03/24/2024    [provider]  fluticasone  (FLONASE ) 50 MCG/ACT nasal spray Place 2 sprays into both nostrils daily. 05/24/23   [name], DO   gabapentin (NEURONTIN) 300 MG capsule Take 300 mg by mouth 3 (three) times daily. Patient not taking: Reported on 03/24/2024 08/12/20   [provider]  hydrOXYzine  (VISTARIL ) 25 MG capsule Take 1 capsule (25 mg total) by mouth every 8 (eight) hours as needed for itching (causes sleepiness). Patient not taking: Reported on 03/24/2024 06/29/23   [name], DO  levothyroxine  (SYNTHROID ) 200 MCG tablet Take 1 tablet (200 mcg total) by mouth daily before breakfast. 03/24/24   [name], DO  Multiple Vitamin (MULTIVITAMIN) tablet Take 1 tablet by mouth daily.    [provider]  naproxen  (NAPROSYN ) 500 MG tablet Take 1 tablet (500 mg total) by mouth 2 (two) times daily as needed. Patient not taking: Reported on 03/24/2024 12/08/23   Dettinger, Fonda LABOR, MD  ondansetron  (ZOFRAN -ODT) 4 MG disintegrating tablet Take 1 tablet (4 mg total) by mouth every 8 (eight) hours as needed for nausea or vomiting. Patient not taking: Reported on 03/24/2024 05/24/23   [name], DO  OVER THE COUNTER MEDICATION Vitamin D  3 2000 IU one capsule daily.    [provider]  sertraline  (ZOLOFT ) 50 MG tablet Take 1 tablet (50 mg total) by mouth daily. 03/24/24   [name], DO    Allergies: Shingrix  [zoster vac recomb adjuvanted]    Review of Systems  Updated Vital Signs BP (!) 141/72 (BP Location: Right Arm)   Pulse 100   Temp 99 F (37.2 C) (Oral)   Resp 18  Ht 5' 4 (1.626 m)   Wt 104.3 kg   SpO2 93%   BMI 39.48 kg/m   Physical Exam Vitals and nursing note reviewed.  Constitutional:      General: She is not in acute distress.    Appearance: She is well-developed. She is not ill-appearing.  HENT:     Mouth/Throat:     Comments: Site of #18 post=surgical in appearance. No bleeding or purulent drainage.  Pulmonary:     Effort: Pulmonary effort is normal.  Musculoskeletal:        General: Normal range of motion.     Cervical back: Normal range of  motion.  Skin:    General: Skin is warm and dry.  Neurological:     Mental Status: She is alert and oriented to person, place, and time.     (all labs ordered are listed, but only abnormal results are displayed) Labs Reviewed - No data to display  EKG: None  Radiology: No results found.   Procedures   Medications Ordered in the ED  acetaminophen  (TYLENOL ) tablet 1,000 mg (1,000 mg Oral Given 04/21/24 2118)    Clinical Course as of 04/21/24 2124  Fri Apr 21, 2024  2122 Patient having post-extraction pain after dental procedure 3 days ago. On appropriate antibiotics. She states she has been referred to an oral surgeon secondary to complications during procedure. Appt Oct 3rd. No facial swelling. No obvious purulence. Tramadol  provided by prescription. Encouraged to keep scheduled appointments.  [SU]    Clinical Course User Index [SU] Odell Balls, PA-C                                 Medical Decision Making       Final diagnoses:  Status post tooth extraction  Pain, dental    ED Discharge Orders          Ordered    traMADol  (ULTRAM ) 50 MG tablet  Every 6 hours PRN        04/21/24 2103               Odell Balls, PA-C 04/21/24 2124    Towana Ozell BROCKS, MD 04/22/24 [phone]

## 2024-04-25 ENCOUNTER — Telehealth: Payer: Self-pay | Admitting: Family Medicine

## 2024-04-25 ENCOUNTER — Ambulatory Visit: Payer: Self-pay

## 2024-04-25 NOTE — Telephone Encounter (Signed)
 I called and tried to explain to pt that she will need to see her pcp for pain rx. DR Jolinda can see her Monday 05/01/2024 but she cannot wait that long. Please call back if Julie Alvarado will not prescribe pain rx to hold her until apt with DR G on MONDAY.

## 2024-04-25 NOTE — Telephone Encounter (Signed)
 Returned patient call to inform patient that Dr. Jolinda can not and will not be sending in tramadol  until the patient is seen by her, either in office or via video visit. I advised patient that it is a [place] law and [place] policy that we can not send in controlled substances without the patient being seen. The patient became very ill saying well I understand that but my dentist said I needed to be taking it but she is not licensed to prescribe controlled substances so she said my PCP would do it for me. This phone call is pointless and my visit tomorrow with that nurse practitioner is pointless to if she can't prescribe the tramadol  I then again reiterated to the patient that she will not be getting tramadol  from anyone in the office until she is seen by her PCP Dr. Jolinda. Patient then said thanks for nothing and hung up the phone.

## 2024-04-25 NOTE — Telephone Encounter (Signed)
 Duplicate. See other note.

## 2024-04-25 NOTE — Telephone Encounter (Signed)
 Ok for dentist to prescribe Tramadol .  If she is needing us  to do this, she will have to be seen since it is a controlled substance.

## 2024-04-25 NOTE — Telephone Encounter (Signed)
 FYI Only or Action Required?: Action required by provider: Dentist office calling and would like patient to have rx for tramadol  since nsaids are not working.  Dentist unable to remove tooth today and patient has been referred to oral surgery but will not be seen for a week..  Patient was last seen in primary care on 03/24/2024 by [name], DO.  Called Nurse Triage reporting Jaw Pain.  Symptoms began today.  Interventions attempted: Nothing.  Symptoms are: stable.  Triage Disposition: Home Care  Patient/caregiver understands and will follow disposition?: Yes      Copied from CRM #[id number]. Topic: Clinical - Red Word Triage >> Apr 25, 2024 12:12 PM Turkey B wrote: [facility] that prompted transfer to Nurse Triage Call from Tammy at [facility],  pt has severe pain in her jaw, nsaids not helping Reason for Disposition  Face pain present < 24 hours  Answer Assessment - Initial Assessment Questions 1. ONSET: When did the pain start? (e.g., minutes, hours, days)     Dental office calling; states they cannot remove tooth and is requesting to have an RX of tramadol  called in.  She has been referred to oral surgery and is not going to be able to get in for a couple of weeks.  States NSAIDs are not working 2. ONSET: Does the pain come and go, or has it been constant since it started? (e.g., constant, intermittent, fleeting)     today 3. SEVERITY: How bad is the pain? (Scale 1-10; mild, moderate or severe)     severe 4. LOCATION: Where does it hurt?      jaw 5. RASH: Is there any redness, rash, or swelling of your face?     no 6. FEVER: Do you have a fever? If Yes, ask: What is it, how was it measured, and when did it start?      no 7. OTHER SYMPTOMS: Do you have any other symptoms? (e.g., fever, toothache, nasal discharge, nasal congestion, clicking sensation in jaw joint)     no 8. PREGNANCY: Is there any chance you are pregnant? When was your last  menstrual period?     na  Protocols used: Face Pain-A-AH

## 2024-04-26 ENCOUNTER — Inpatient Hospital Stay: Admitting: Nurse Practitioner

## 2024-05-01 ENCOUNTER — Ambulatory Visit: Admitting: Family Medicine

## 2024-05-01 ENCOUNTER — Encounter: Payer: Self-pay | Admitting: Family Medicine

## 2024-05-01 VITALS — BP 126/72 | HR 63 | Temp 98.2°F | Ht 64.5 in | Wt 233.1 lb

## 2024-05-01 DIAGNOSIS — M5410 Radiculopathy, site unspecified: Secondary | ICD-10-CM

## 2024-05-01 DIAGNOSIS — K0889 Other specified disorders of teeth and supporting structures: Secondary | ICD-10-CM | POA: Diagnosis not present

## 2024-05-01 MED ORDER — NAPROXEN 500 MG PO TABS: 500.0000 mg | ORAL_TABLET | Freq: Two times a day (BID) | ORAL | 1 refills | Status: AC | PRN

## 2024-05-01 MED ORDER — TRAMADOL HCL 50 MG PO TABS: 50.0000 mg | ORAL_TABLET | Freq: Four times a day (QID) | ORAL | 0 refills | Status: AC | PRN

## 2024-05-01 NOTE — Progress Notes (Signed)
 Subjective: CC: Dental pain PCP: Julie Norene HERO, DO YEP:Julie Alvarado is a 53 y.o. female presenting to clinic today for:  Dental pain Patient had one of her molars on the left lower jawline removed recently by the dentist but the dentist told her that she could not prescribe her any pain medication but did recommend tramadol  and told her that she should just get this from her primary care doctor.  She apparently had to seek care in the ER because she cannot secure an appointment.  She has been utilizing Naprosyn , Toradol  which was prescribed by the dentist and just completed her amoxicillin .  She is actually being referred to an oral surgeon whom she was supposed to see tomorrow but after reading his review she did not want to see him.  She is asking for a new referral.  She denies any fevers but does have quite a bit of discomfort in the lower jawline because she has exposed nerve roots.  She is performing irrigation.  Eating soft foods   ROS: Per HPI  Allergies  Allergen Reactions   Shingrix  [Zoster Vac Recomb Adjuvanted] Swelling    Joint pain , body pain swelling , fatigue     Past Medical History:  Diagnosis Date   Allergy    Anxiety    Asthma    Blood transfusion without reported diagnosis    Depression    GERD (gastroesophageal reflux disease)    Thyroid  disease     Current Outpatient Medications:    amphetamine -dextroamphetamine  (ADDERALL) 10 MG tablet, Take 20mg  each morning and 10mg  each afternoon, Disp: 90 tablet, Rfl: 0   [START ON 05/04/2024] amphetamine -dextroamphetamine  (ADDERALL) 10 MG tablet, Take 20mg  each morning and 10mg  each afternoon, Disp: 90 tablet, Rfl: 0   [START ON 06/03/2024] amphetamine -dextroamphetamine  (ADDERALL) 10 MG tablet, Take 20mg  each morning and 10mg  each afternoon, Disp: 90 tablet, Rfl: 0   b complex vitamins capsule, Take 1 capsule by mouth daily., Disp: , Rfl:    buPROPion  (WELLBUTRIN  XL) 150 MG 24 hr tablet, Take 1 tablet (150 mg  total) by mouth daily. Dose reduction, Disp: 90 tablet, Rfl: 3   famotidine (PEPCID) 20 MG tablet, Take 20 mg by mouth daily., Disp: , Rfl:    fexofenadine (ALLEGRA) 180 MG tablet, Take 180 mg by mouth daily., Disp: , Rfl:    fluticasone  (FLONASE ) 50 MCG/ACT nasal spray, Place 2 sprays into both nostrils daily., Disp: 16 g, Rfl: 6   levothyroxine  (SYNTHROID ) 200 MCG tablet, Take 1 tablet (200 mcg total) by mouth daily before breakfast., Disp: 90 tablet, Rfl: 3   Multiple Vitamin (MULTIVITAMIN) tablet, Take 1 tablet by mouth daily., Disp: , Rfl:    OVER THE COUNTER MEDICATION, Vitamin D  3 2000 IU one capsule daily., Disp: , Rfl:    sertraline  (ZOLOFT ) 50 MG tablet, Take 1 tablet (50 mg total) by mouth daily., Disp: 90 tablet, Rfl: 3   traMADol  (ULTRAM ) 50 MG tablet, Take 1 tablet (50 mg total) by mouth every 6 (six) hours as needed., Disp: 15 tablet, Rfl: 0 Social History   Socioeconomic History   Marital status: Single    Spouse name: Not on file   Number of children: Not on file   Years of education: Not on file   Highest education level: Not on file  Occupational History   Not on file  Tobacco Use   Smoking status: Every Day    Current packs/day: 0.20    Average packs/day: 0.2 packs/day for 15.0  years (3.0 ttl pk-yrs)    Types: Cigarettes   Smokeless tobacco: Never  Vaping Use   Vaping status: Never Used  Substance and Sexual Activity   Alcohol use: Not Currently    Comment: Rarely   Drug use: Never   Sexual activity: Yes    Birth control/protection: I.U.D.  Other Topics Concern   Not on file  Social History Narrative   Not on file   Social Drivers of Health   Financial Resource Strain: Not on file  Food Insecurity: No Food Insecurity (06/10/2022)   Received from [facility]   Hunger Vital Sign    Within the past 12 months, you worried that your food would run out before you got the money to buy more.: Never true    Within the past 12 months, the food you bought just  didn't last and you didn't have money to get more.: Never true  Transportation Needs: Not on file  Physical Activity: Not on file  Stress: Not on file  Social Connections: Unknown (12/16/2021)   [facility]   Social Network    Social Network: Not on file  Intimate Partner Violence: Unknown (11/07/2021)   Received from [facility]   HITS    Physically Hurt: Not on file    Insult or Talk Down To: Not on file    Threaten Physical Harm: Not on file    Scream or Curse: Not on file   Family History  Problem Relation Age of Onset   Breast cancer Mother    Cancer Mother    COPD Mother    Pancreatic cancer Father    COPD Father    Cancer Maternal Grandmother    COPD Maternal Grandmother    Colon cancer Neg Hx    Esophageal cancer Neg Hx    Rectal cancer Neg Hx    Stomach cancer Neg Hx     Objective: Office vital signs reviewed. BP 126/72   Pulse 63   Temp 98.2 F (36.8 C)   Ht 5' 4.5 (1.638 m)   Wt 233 lb 2 oz (105.7 kg)   SpO2 95%   BMI 39.40 kg/m   Physical Examination:  General: Awake, alert, nontoxic female, No acute distress HEENT: Palpable knot along the left lower jawline.  She has erythema and inflammation surrounding the molar removal site  Assessment/ Plan: 53 y.o. female   Pain, dental - Plan: traMADol  (ULTRAM ) 50 MG tablet, naproxen  (NAPROSYN ) 500 MG tablet, Ambulatory referral to Oral Maxillofacial Surgery, CANCELED: Ambulatory referral to Oral Maxillofacial Surgery  Nerve root pain - Plan: traMADol  (ULTRAM ) 50 MG tablet, naproxen  (NAPROSYN ) 500 MG tablet, Ambulatory referral to Oral Maxillofacial Surgery, CANCELED: Ambulatory referral to Oral Maxillofacial Surgery   Tramadol  renewed.  Naprosyn  renewed.  Caution for GI bleed with SSRI.  Not to be used with Toradol .  National narcotic database reviewed and there were no red flags.  Stat referral to oral surgery placed for ongoing exposed nerve root pain.  Discussed red flag signs and symptoms  warranting further evaluation.   Norene Julie Fielding, DO [facility] [phone]

## 2024-05-09 ENCOUNTER — Ambulatory Visit: Payer: Medicaid Other | Admitting: Family Medicine

## 2024-05-09 ENCOUNTER — Ambulatory Visit: Payer: Self-pay | Admitting: Family Medicine

## 2024-05-09 ENCOUNTER — Encounter: Payer: Self-pay | Admitting: Family Medicine

## 2024-05-09 VITALS — BP 120/71 | HR 77 | Temp 97.8°F | Ht 64.5 in | Wt 232.4 lb

## 2024-05-09 DIAGNOSIS — F900 Attention-deficit hyperactivity disorder, predominantly inattentive type: Secondary | ICD-10-CM | POA: Diagnosis not present

## 2024-05-09 DIAGNOSIS — Z0001 Encounter for general adult medical examination with abnormal findings: Secondary | ICD-10-CM

## 2024-05-09 DIAGNOSIS — K219 Gastro-esophageal reflux disease without esophagitis: Secondary | ICD-10-CM

## 2024-05-09 DIAGNOSIS — Z0000 Encounter for general adult medical examination without abnormal findings: Secondary | ICD-10-CM

## 2024-05-09 DIAGNOSIS — Z23 Encounter for immunization: Secondary | ICD-10-CM

## 2024-05-09 DIAGNOSIS — E063 Autoimmune thyroiditis: Secondary | ICD-10-CM

## 2024-05-09 DIAGNOSIS — Z6839 Body mass index (BMI) 39.0-39.9, adult: Secondary | ICD-10-CM | POA: Diagnosis not present

## 2024-05-09 DIAGNOSIS — F329 Major depressive disorder, single episode, unspecified: Secondary | ICD-10-CM

## 2024-05-09 LAB — BAYER DCA HB A1C WAIVED: HB A1C (BAYER DCA - WAIVED): 5.7 % — ABNORMAL HIGH (ref 4.8–5.6)

## 2024-05-09 LAB — LIPID PANEL

## 2024-05-09 MED ORDER — BUPROPION HCL ER (SR) 150 MG PO TB12: ORAL_TABLET | ORAL | 0 refills | Status: AC

## 2024-05-09 MED ORDER — AMPHETAMINE-DEXTROAMPHETAMINE 10 MG PO TABS: ORAL_TABLET | ORAL | 0 refills | Status: AC

## 2024-05-09 NOTE — Progress Notes (Signed)
 Julie Alvarado is a 53 y.o. female presents to office today for annual physical exam examination.    She reports ongoing stressors related to her son, her father's health and her own health.  She would like to start weaning from the Wellbutrin .  She is compliant with Zoloft .  She still has not gotten her colonoscopy but this is on her to do list.  She still has not gotten a new appointment with a different oral surgeon but also has not contacted her old dentist about changing that referral.  Oral pain is relatively stable and unchanged from previous.  She reports no fevers.  Marital status: single, Substance use: none There are no preventive care reminders to display for this patient.  Immunization History  Administered Date(s) Administered   DTaP 07/14/1971, 10/06/1971, 03/16/1972, 03/22/1975, 11/25/1994   Hepatitis B 06/10/2011, 07/15/2011, 10/06/2011   IPV 08/18/1971, 11/17/1971, 03/16/1972, 03/21/1974   Influenza Nasal 04/19/2013   Influenza, Seasonal, Injecte, Preservative Fre 09/22/2023, 05/09/2024   Influenza,inj,Quad PF,6+ Mos 04/05/2019, 08/23/2020, 07/03/2022   Influenza-Unspecified 06/10/2011   MMR 02/02/1973, 08/21/1997   Moderna Sars-Covid-2 Vaccination 08/29/2019, 09/29/2019, 09/03/2020   PNEUMOCOCCAL CONJUGATE-20 12/22/2023   Td 07/03/2022   Tdap 06/10/2011   Varicella 07/03/2011, 08/17/2011   Zoster Recombinant(Shingrix ) 04/01/2022   Past Medical History:  Diagnosis Date   Allergy    Anxiety    Asthma    Blood transfusion without reported diagnosis    Depression    GERD (gastroesophageal reflux disease)    Thyroid  disease    Social History   Socioeconomic History   Marital status: Single    Spouse name: Not on file   Number of children: Not on file   Years of education: Not on file   Highest education level: Not on file  Occupational History   Not on file  Tobacco Use   Smoking status: Every Day    Current packs/day: 0.20    Average packs/day: 0.2  packs/day for 15.0 years (3.0 ttl pk-yrs)    Types: Cigarettes   Smokeless tobacco: Never  Vaping Use   Vaping status: Never Used  Substance and Sexual Activity   Alcohol use: Not Currently    Comment: Rarely   Drug use: Never   Sexual activity: Yes    Birth control/protection: I.U.D.  Other Topics Concern   Not on file  Social History Narrative   Not on file   Social Drivers of Health   Financial Resource Strain: Not on file  Food Insecurity: No Food Insecurity (06/10/2022)   Received from [facility]   Hunger Vital Sign    Within the past 12 months, you worried that your food would run out before you got the money to buy more.: Never true    Within the past 12 months, the food you bought just didn't last and you didn't have money to get more.: Never true  Transportation Needs: Not on file  Physical Activity: Not on file  Stress: Not on file  Social Connections: Unknown (12/16/2021)   Received from [facility]   Social Network    Social Network: Not on file  Intimate Partner Violence: Unknown (11/07/2021)   Received from [facility]   HITS    Physically Hurt: Not on file    Insult or Talk Down To: Not on file    Threaten Physical Harm: Not on file    Scream or Curse: Not on file   Past Surgical History:  Procedure Laterality Date   CESAREAN SECTION  COLONOSCOPY  06/21/2018   Dr.Perry   EXTERNAL EAR SURGERY     TONSILECTOMY, ADENOIDECTOMY, BILATERAL MYRINGOTOMY AND TUBES     Family History  Problem Relation Age of Onset   Breast cancer Mother    Cancer Mother    COPD Mother    Pancreatic cancer Father    COPD Father    Squamous cell carcinoma Father        skin   Cancer Maternal Grandmother    COPD Maternal Grandmother    Colon cancer Neg Hx    Esophageal cancer Neg Hx    Rectal cancer Neg Hx    Stomach cancer Neg Hx     Current Outpatient Medications:    amphetamine -dextroamphetamine  (ADDERALL) 10 MG tablet, Take 20mg  each morning and 10mg  each  afternoon, Disp: 90 tablet, Rfl: 0   [START ON 06/03/2024] amphetamine -dextroamphetamine  (ADDERALL) 10 MG tablet, Take 20mg  each morning and 10mg  each afternoon, Disp: 90 tablet, Rfl: 0   b complex vitamins capsule, Take 1 capsule by mouth daily., Disp: , Rfl:    buPROPion  (WELLBUTRIN  SR) 150 MG 12 hr tablet, Take 1 tablet (150 mg total) by mouth daily for 28 days, THEN 1 tablet (150 mg total) every other day for 28 days. Then stop., Disp: 42 tablet, Rfl: 0   famotidine (PEPCID) 20 MG tablet, Take 20 mg by mouth daily., Disp: , Rfl:    fexofenadine (ALLEGRA) 180 MG tablet, Take 180 mg by mouth daily., Disp: , Rfl:    fluticasone  (FLONASE ) 50 MCG/ACT nasal spray, Place 2 sprays into both nostrils daily., Disp: 16 g, Rfl: 6   levothyroxine  (SYNTHROID ) 200 MCG tablet, Take 1 tablet (200 mcg total) by mouth daily before breakfast., Disp: 90 tablet, Rfl: 3   Multiple Vitamin (MULTIVITAMIN) tablet, Take 1 tablet by mouth daily., Disp: , Rfl:    naproxen  (NAPROSYN ) 500 MG tablet, Take 1 tablet (500 mg total) by mouth 2 (two) times daily as needed for moderate pain (pain score 4-6)., Disp: 60 tablet, Rfl: 1   OVER THE COUNTER MEDICATION, Vitamin D  3 2000 IU one capsule daily., Disp: , Rfl:    sertraline  (ZOLOFT ) 50 MG tablet, Take 1 tablet (50 mg total) by mouth daily., Disp: 90 tablet, Rfl: 3   traMADol  (ULTRAM ) 50 MG tablet, Take 1 tablet (50 mg total) by mouth every 6 (six) hours as needed for severe pain (pain score 7-10)., Disp: 15 tablet, Rfl: 0   [START ON 07/03/2024] amphetamine -dextroamphetamine  (ADDERALL) 10 MG tablet, Take 20mg  each morning and 10mg  each afternoon, Disp: 90 tablet, Rfl: 0  Allergies  Allergen Reactions   Shingrix  [Zoster Vac Recomb Adjuvanted] Swelling    Joint pain , body pain swelling , fatigue       ROS: Review of Systems Pertinent items noted in HPI and remainder of comprehensive ROS otherwise negative.    Physical exam BP 120/71   Pulse 77   Temp 97.8 F (36.6 C)    Ht 5' 4.5 (1.638 m)   Wt 232 lb 6 oz (105.4 kg)   SpO2 95%   BMI 39.27 kg/m  General appearance: alert, cooperative, appears stated age, no distress, and morbidly obese Head: Normocephalic, without obvious abnormality, atraumatic Eyes: negative findings: lids and lashes normal, conjunctivae and sclerae normal, corneas clear, and pupils equal, round, reactive to light and accomodation Ears: normal TM's and external ear canals both ears Nose: Nares normal. Septum midline. Mucosa normal. No drainage or sinus tenderness. Throat: broken tooth on left lower. MMM  Neck: no adenopathy, no carotid bruit, supple, symmetrical, trachea midline, and thyroid  not enlarged, symmetric, no tenderness/mass/nodules Back: symmetric, no curvature. ROM normal. No CVA tenderness. Lungs: clear to auscultation bilaterally Heart: regular rate and rhythm, S1, S2 normal, no murmur, click, rub or gallop Abdomen: soft, non-tender; bowel sounds normal; no masses,  no organomegaly Extremities: extremities normal, atraumatic, no cyanosis or edema Pulses: 2+ and symmetric Skin: Skin color, texture, turgor normal. No rashes or lesions Lymph nodes: Cervical, supraclavicular, and axillary nodes normal. Neurologic: Alert and oriented X 3, normal strength and tone. Normal symmetric reflexes. Normal coordination and gait      05/09/2024    9:42 AM 05/01/2024    2:32 PM 03/24/2024    2:53 PM  Depression screen PHQ 2/9  Decreased Interest 3 3 2   Down, Depressed, Hopeless 3 2 3   PHQ - 2 Score 6 5 5   Altered sleeping 3 3 3   Tired, decreased energy 3 3 3   Change in appetite 3 3 3   Feeling bad or failure about yourself  2 1 2   Trouble concentrating 1 1 1   Moving slowly or fidgety/restless 0 0 0  Suicidal thoughts 0 0 0  PHQ-9 Score 18 16 17   Difficult doing work/chores Very difficult Very difficult Very difficult      05/09/2024    9:43 AM 05/01/2024    2:33 PM 03/24/2024    2:53 PM 12/22/2023    3:53 PM  GAD 7 :  Generalized Anxiety Score  Nervous, Anxious, on Edge 3 3 3 3   Control/stop worrying 2 3 3 3   Worry too much - different things 3 3 3 3   Trouble relaxing 3 3 3 3   Restless 0 0 0 0  Easily annoyed or irritable 2 1 1 1   Afraid - awful might happen 3 2 3 3   Total GAD 7 Score 16 15 16 16   Anxiety Difficulty Very difficult Very difficult Somewhat difficult Very difficult    Recent Results (from the past 2160 hours)  FSH/LH     Status: None   Collection Time: 03/24/24  3:22 PM  Result Value Ref Range   LH 39.8 mIU/mL    Comment:                      Adult Female              Range                       Follicular phase      2.4 -  12.6                       Ovulation phase      14.0 -  95.6                       Luteal phase          1.0 -  11.4                       Postmenopausal        7.7 -  58.5    FSH 39.3 mIU/mL    Comment:                      Adult Female             Range  Follicular phase      3.5 -  12.5                       Ovulation phase       4.7 -  21.5                       Luteal phase          1.7 -   7.7                       Postmenopausal       25.8 - 134.8      Assessment/ Plan: Tillman Pier here for annual physical exam.   Annual physical exam  Hypothyroidism due to Hashimoto's thyroiditis - Plan: CMP14+EGFR, TSH + free T4  Gastroesophageal reflux disease without esophagitis - Plan: CMP14+EGFR, CBC with Differential  Attention deficit hyperactivity disorder (ADHD), predominantly inattentive type - Plan: CMP14+EGFR, amphetamine -dextroamphetamine  (ADDERALL) 10 MG tablet  Morbid obesity (HCC) - Plan: CMP14+EGFR, Lipid Panel, Bayer DCA Hb A1c Waived, VITAMIN D  25 Hydroxy (Vit-D Deficiency, Fractures)  BMI 39.0-39.9,adult - Plan: CMP14+EGFR, Lipid Panel, Bayer DCA Hb A1c Waived, VITAMIN D  25 Hydroxy (Vit-D Deficiency, Fractures)  Encounter for immunization - Plan: Flu vaccine trivalent PF, 6mos and  older(Flulaval,Afluria,Fluarix,Fluzone)  Reactive depression - Plan: buPROPion  (WELLBUTRIN  SR) 150 MG 12 hr tablet   Influenza vaccination administered.  Fasting labs collected.  Having depression and anxiety symptoms I think these are reactive given multiple stressors.  She wants to wean off of Wellbutrin .  We discussed that if she destabilizes I had highly recommend that she resume the extended release version and we should consider advancing the Zoloft .  She was amenable to this plan  ADHD is stable.  Future order for Adderall placed.  Chronic issues are stable otherwise  Counseled on healthy lifestyle choices, including diet (rich in fruits, vegetables and lean meats and low in salt and simple carbohydrates) and exercise (at least 30 minutes of moderate physical activity daily).  Patient to follow up 37m for ADHD/ mood  Victorya Hillman M. Jolinda, DO

## 2024-05-10 LAB — LIPID PANEL
Cholesterol, Total: 181 mg/dL (ref 100–199)
HDL: 40 mg/dL (ref >39)
LDL CALC COMMENT:: 4.5 ratio — AB (ref 0.0–4.4)
LDL Chol Calc (NIH): 116 mg/dL — AB (ref 0–99)
Triglycerides: 137 mg/dL (ref 0–149)
VLDL Cholesterol Cal: 25 mg/dL (ref 5–40)

## 2024-05-10 LAB — CMP14+EGFR
ALT: 32 IU/L (ref 0–32)
AST: 30 IU/L (ref 0–40)
Albumin: 4.4 g/dL (ref 3.8–4.9)
Alkaline Phosphatase: 63 IU/L (ref 49–135)
BUN/Creatinine Ratio: 10 (ref 9–23)
BUN: 9 mg/dL (ref 6–24)
Bilirubin Total: 0.4 mg/dL (ref 0.0–1.2)
CO2: 21 mmol/L (ref 20–29)
Calcium: 10.1 mg/dL (ref 8.7–10.2)
Chloride: 101 mmol/L (ref 96–106)
Creatinine, Ser: 0.92 mg/dL (ref 0.57–1.00)
Globulin, Total: 3.2 g/dL (ref 1.5–4.5)
Glucose: 97 mg/dL (ref 70–99)
Potassium: 4.3 mmol/L (ref 3.5–5.2)
Sodium: 140 mmol/L (ref 134–144)
Total Protein: 7.6 g/dL (ref 6.0–8.5)
eGFR: 75 mL/min/1.73 (ref >59)

## 2024-05-10 LAB — CBC WITH DIFFERENTIAL/PLATELET
Basophils Absolute: 0.1 x10E3/uL (ref 0.0–0.2)
Basos: 1 %
EOS (ABSOLUTE): 0.2 x10E3/uL (ref 0.0–0.4)
Eos: 3 %
Hematocrit: 43 % (ref 34.0–46.6)
Hemoglobin: 14.8 g/dL (ref 11.1–15.9)
Immature Grans (Abs): 0 x10E3/uL (ref 0.0–0.1)
Immature Granulocytes: 0 %
Lymphocytes Absolute: 3.4 x10E3/uL — ABNORMAL HIGH (ref 0.7–3.1)
Lymphs: 41 %
MCH: 33.3 pg — ABNORMAL HIGH (ref 26.6–33.0)
MCHC: 34.4 g/dL (ref 31.5–35.7)
MCV: 97 fL (ref 79–97)
Monocytes Absolute: 0.7 x10E3/uL (ref 0.1–0.9)
Monocytes: 8 %
Neutrophils Absolute: 3.8 x10E3/uL (ref 1.4–7.0)
Neutrophils: 47 %
Platelets: 337 x10E3/uL (ref 150–450)
RBC: 4.45 x10E6/uL (ref 3.77–5.28)
RDW: 12.8 % (ref 11.7–15.4)
WBC: 8.2 x10E3/uL (ref 3.4–10.8)

## 2024-05-10 LAB — TSH+FREE T4
Free T4: 1.25 ng/dL (ref 0.82–1.77)
TSH: 30.4 u[IU]/mL — AB (ref 0.450–4.500)

## 2024-05-10 LAB — VITAMIN D 25 HYDROXY (VIT D DEFICIENCY, FRACTURES): Vit D, 25-Hydroxy: 45.4 ng/mL (ref 30.0–100.0)

## 2024-05-10 MED ORDER — LEVOTHYROXINE SODIUM 25 MCG PO TABS: 25.0000 ug | ORAL_TABLET | Freq: Every day | ORAL | 3 refills | Status: AC

## 2024-05-14 DIAGNOSIS — Z419 Encounter for procedure for purposes other than remedying health state, unspecified: Secondary | ICD-10-CM | POA: Diagnosis not present

## 2024-05-16 IMAGING — CT CT RENAL STONE PROTOCOL
2 of 4 series · 16 of 46 positions shown, 18 images · non-contrast
Comparison: None Available.

CLINICAL DATA: Flank pain.  Concern for kidney stone.



[Series 2: axial st · axial · 0.90mm/px · z∈[+795,+1250]mm · 13 of 105 slices shown, 15 images]
[im 7/105  soft-tissue]
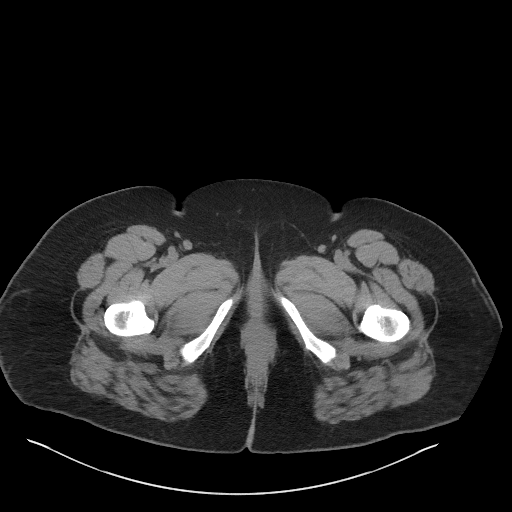
[im 7/105  bone]
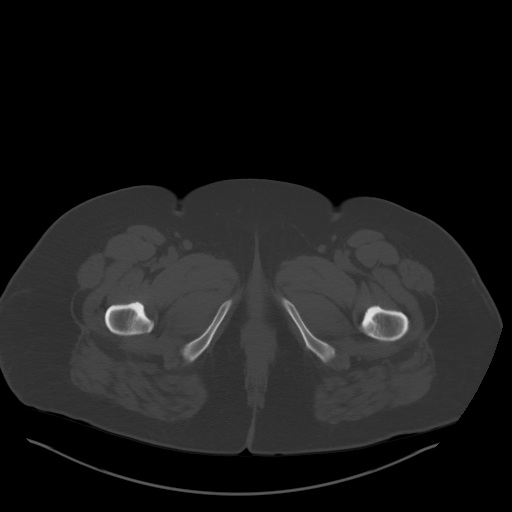
[im 14/105  soft-tissue]
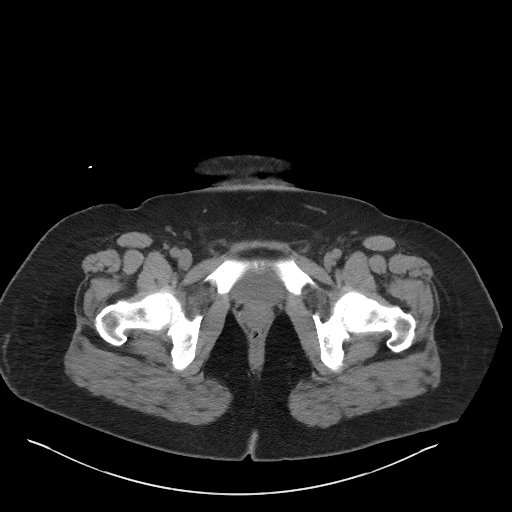
[im 21/105  soft-tissue]
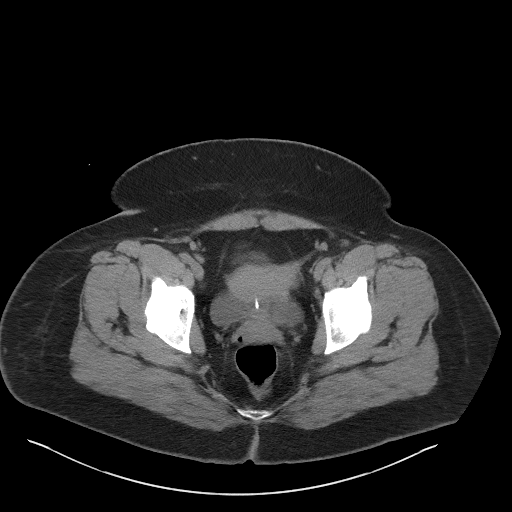
[im 28/105  soft-tissue]
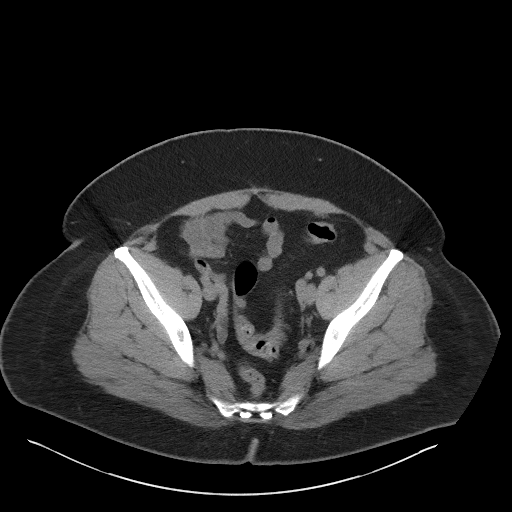
[im 35/105  soft-tissue]
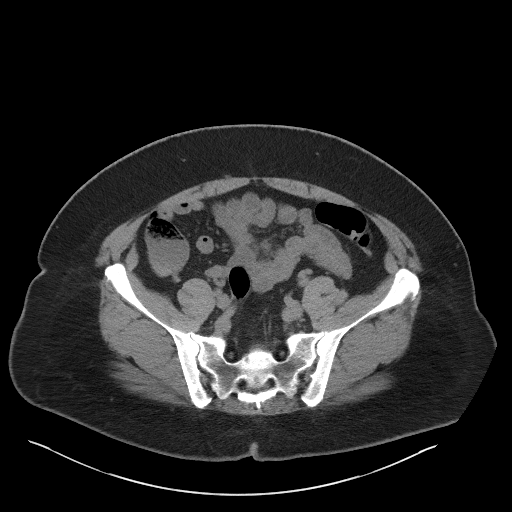
[im 42/105  soft-tissue]
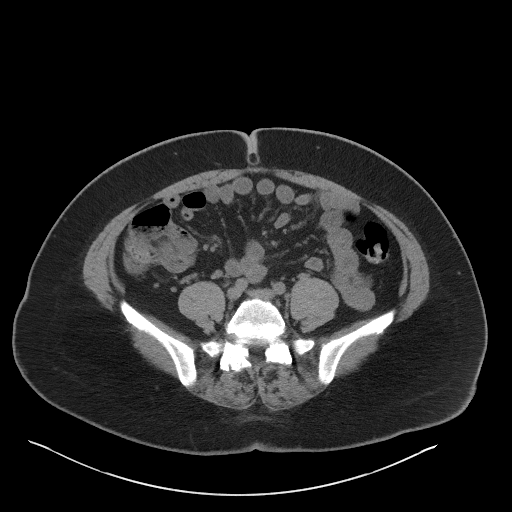
[im 56/105  soft-tissue]
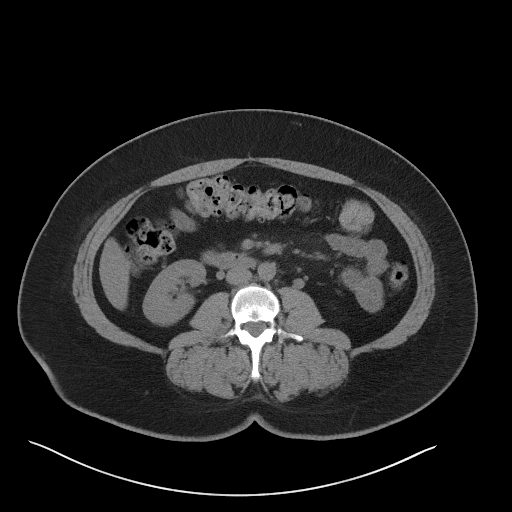
[im 63/105  soft-tissue]
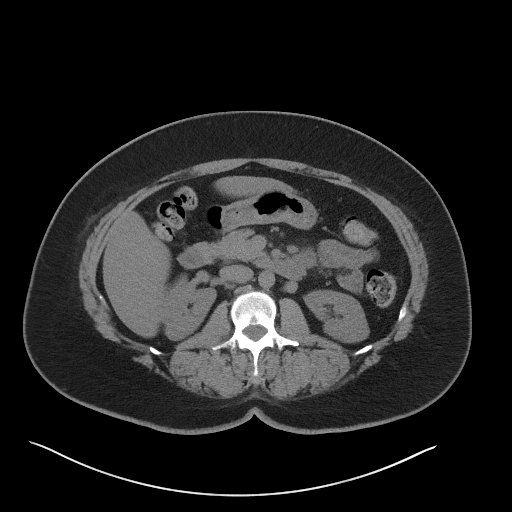
[im 70/105  soft-tissue]
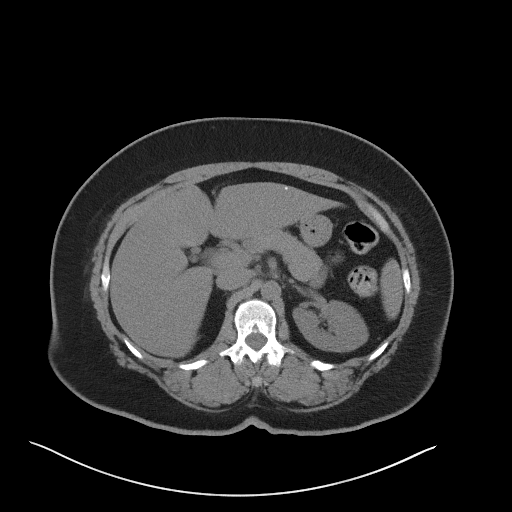
[im 70/105  bone]
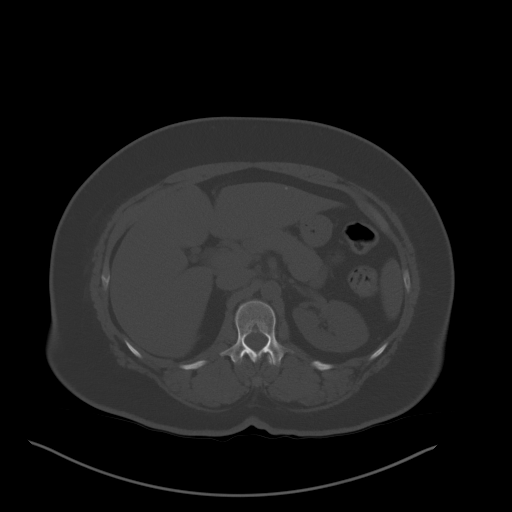
[im 77/105  soft-tissue]
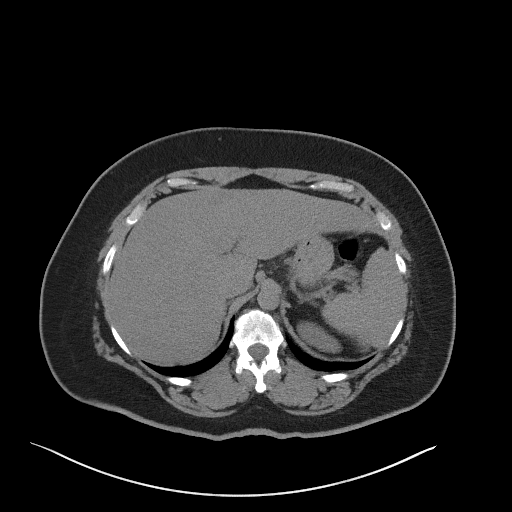
[im 84/105  soft-tissue]
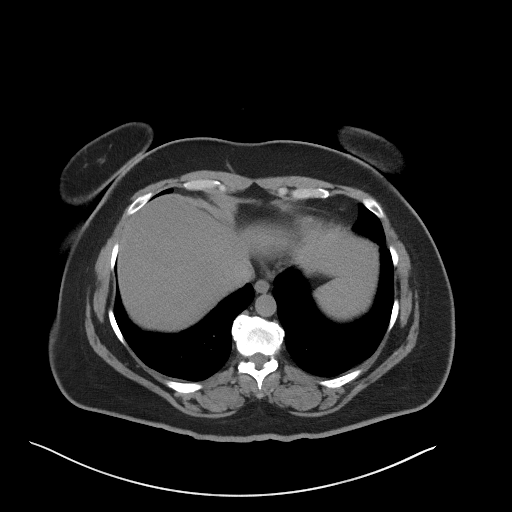
[im 91/105  soft-tissue]
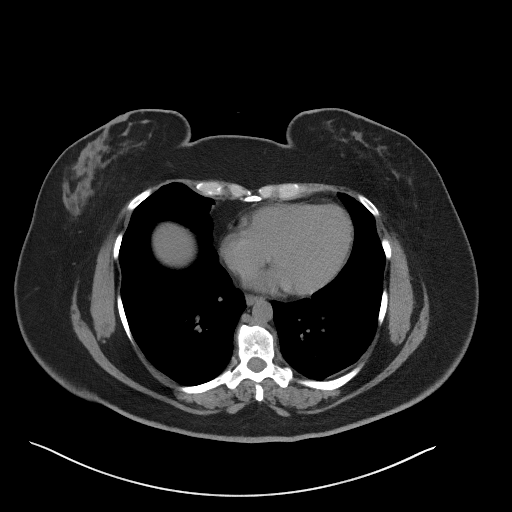
[im 98/105  soft-tissue]
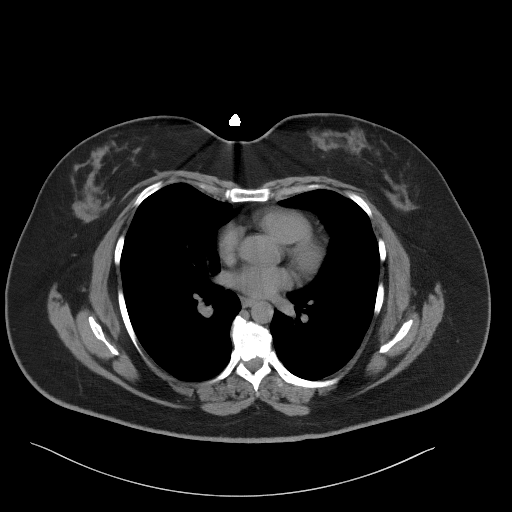

[Series 5: coronal st · coronal · 0.82mm/px · 3 of 114 slices shown]
[im 38/114  soft-tissue]
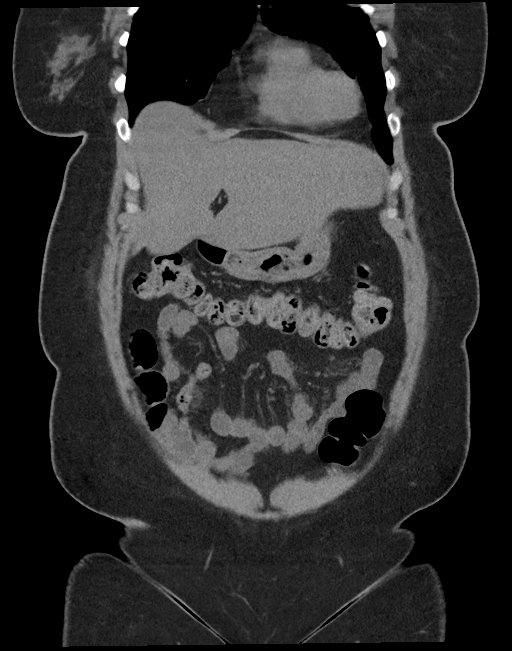
[im 51/114  soft-tissue]
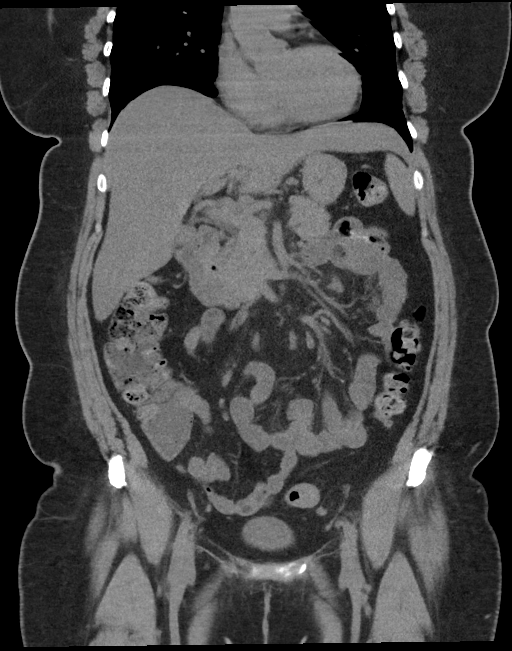
[im 63/114  soft-tissue]
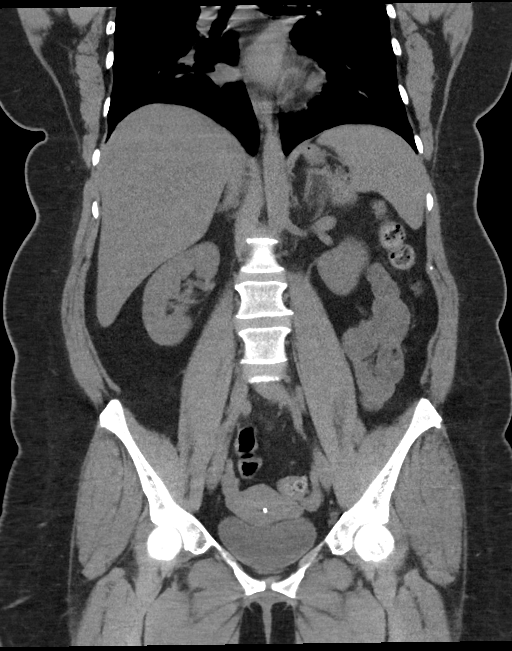

[16 of 46 positions shown; findings below may reference images not displayed]

FINDINGS: Evaluation of this exam is limited in the absence of intravenous
contrast.

Lower chest: The visualized lung bases are clear.

No intra-abdominal free air or free fluid.

Hepatobiliary: Fatty liver. No intrahepatic biliary dilatation. The
gallbladder is unremarkable.

Pancreas: Unremarkable. No pancreatic ductal dilatation or
surrounding inflammatory changes.

Spleen: Normal in size without focal abnormality.

Adrenals/Urinary Tract: The kidneys, visualized ureters, and urinary
bladder appear unremarkable.

Stomach/Bowel: There is sigmoid diverticulosis without active
inflammatory changes. There is moderate stool throughout the colon.
There is no bowel obstruction or active inflammation. The appendix
is normal.

Vascular/Lymphatic: The abdominal aorta and IVC are grossly
unremarkable on this noncontrast CT. No portal venous gas. There is
no adenopathy. There is haziness of the mesentery with several
top-normal lymph nodes, nonspecific.

Reproductive: The uterus is anteverted. An intrauterine device is
noted. No adnexal masses.

Other: None

Musculoskeletal: No acute or significant osseous findings.
IMPRESSION: 1. No acute intra-abdominal or pelvic pathology. No hydronephrosis
or nephrolithiasis.
2. Sigmoid diverticulosis without active inflammatory changes.
3. Fatty liver.

## 2024-06-26 ENCOUNTER — Ambulatory Visit (INDEPENDENT_AMBULATORY_CARE_PROVIDER_SITE_OTHER): Payer: Self-pay | Admitting: Family Medicine

## 2024-06-26 ENCOUNTER — Encounter: Payer: Self-pay | Admitting: Family Medicine

## 2024-06-26 VITALS — BP 111/73 | HR 92 | Temp 98.1°F | Ht 64.5 in | Wt 231.0 lb

## 2024-06-26 DIAGNOSIS — F17200 Nicotine dependence, unspecified, uncomplicated: Secondary | ICD-10-CM

## 2024-06-26 DIAGNOSIS — R5382 Chronic fatigue, unspecified: Secondary | ICD-10-CM

## 2024-06-26 DIAGNOSIS — E063 Autoimmune thyroiditis: Secondary | ICD-10-CM | POA: Diagnosis not present

## 2024-06-26 DIAGNOSIS — F900 Attention-deficit hyperactivity disorder, predominantly inattentive type: Secondary | ICD-10-CM | POA: Diagnosis not present

## 2024-06-26 MED ORDER — AMPHETAMINE-DEXTROAMPHETAMINE 10 MG PO TABS: ORAL_TABLET | ORAL | 0 refills | Status: AC

## 2024-06-26 NOTE — Progress Notes (Signed)
 Subjective: [name]/ mood, thyroid  PCP: [name], DO YEP:Hpwj [place] is a 53 y.o. female presenting to clinic today for:  Last visit patient wanted to start weaning from the Wellbutrin .  She reports that mood has been slightly better after adjusting her thyroid  below.  Attention has been stable with Adderall.  She actually never stopped taking the Wellbutrin  extended release 300 mg because she was afraid to adjust this medication while we were working on her thyroid  levels  She was noted to have grossly abnormal TSH on last lab draw.  We adjusted her thyroid  medication and today she reports that symptoms have been slightly better.  She reports improvement in energy and leg pain.  Though she does report that she still has low energy is just not no energy.  Admits to snoring.  Would like to have vitamins checked along with thyroid  today if possible.  Denies any bleeding.  She reports that she is down to just a few cigarettes per day and is transitioning over to vape with hopes that she can transition off of smoking totally soon   ROS: Per HPI  Allergies  Allergen Reactions   Shingrix  [Zoster Vac Recomb Adjuvanted] Swelling    Joint pain , body pain swelling , fatigue     Past Medical History:  Diagnosis Date   Allergy    Anxiety    Asthma    Blood transfusion without reported diagnosis    Depression    GERD (gastroesophageal reflux disease)    Thyroid  disease     Current Outpatient Medications:    [START ON 07/03/2024] amphetamine -dextroamphetamine  (ADDERALL) 10 MG tablet, Take 20mg  each morning and 10mg  each afternoon, Disp: 90 tablet, Rfl: 0   buPROPion  (WELLBUTRIN  SR) 150 MG 12 hr tablet, Take 1 tablet (150 mg total) by mouth daily for 28 days, THEN 1 tablet (150 mg total) every other day for 28 days. Then stop., Disp: 42 tablet, Rfl: 0   famotidine (PEPCID) 20 MG tablet, Take 20 mg by mouth daily., Disp: , Rfl:    fexofenadine (ALLEGRA) 180 MG tablet, Take 180 mg  by mouth daily., Disp: , Rfl:    fluticasone  (FLONASE ) 50 MCG/ACT nasal spray, Place 2 sprays into both nostrils daily., Disp: 16 g, Rfl: 6   levothyroxine  (SYNTHROID ) 200 MCG tablet, Take 1 tablet (200 mcg total) by mouth daily before breakfast., Disp: 90 tablet, Rfl: 3   levothyroxine  (SYNTHROID ) 25 MCG tablet, Take 1 tablet (25 mcg total) by mouth daily. Take with 200mcg to equal , Disp: 90 tablet, Rfl: 3   naproxen  (NAPROSYN ) 500 MG tablet, Take 1 tablet (500 mg total) by mouth 2 (two) times daily as needed for moderate pain (pain score 4-6)., Disp: 60 tablet, Rfl: 1   OVER THE COUNTER MEDICATION, Vitamin D  3 2000 IU one capsule daily., Disp: , Rfl:    sertraline  (ZOLOFT ) 50 MG tablet, Take 1 tablet (50 mg total) by mouth daily., Disp: 90 tablet, Rfl: 3   traMADol  (ULTRAM ) 50 MG tablet, Take 1 tablet (50 mg total) by mouth every 6 (six) hours as needed for severe pain (pain score 7-10)., Disp: 15 tablet, Rfl: 0   [START ON 08/02/2024] amphetamine -dextroamphetamine  (ADDERALL) 10 MG tablet, Take 20mg  each morning and 10mg  each afternoon, Disp: 90 tablet, Rfl: 0   [START ON 09/01/2024] amphetamine -dextroamphetamine  (ADDERALL) 10 MG tablet, Take 20mg  each morning and 10mg  each afternoon, Disp: 90 tablet, Rfl: 0   b complex vitamins capsule, Take 1 capsule by mouth daily. (  Patient not taking: Reported on 06/26/2024), Disp: , Rfl:    Multiple Vitamin (MULTIVITAMIN) tablet, Take 1 tablet by mouth daily. (Patient not taking: Reported on 06/26/2024), Disp: , Rfl:  Social History   Socioeconomic History   Marital status: Single    Spouse name: Not on file   Number of children: Not on file   Years of education: Not on file   Highest education level: Not on file  Occupational History   Not on file  Tobacco Use   Smoking status: Every Day    Current packs/day: 0.20    Average packs/day: 0.2 packs/day for 15.0 years (3.0 ttl pk-yrs)    Types: Cigarettes   Smokeless tobacco: Never  Vaping Use    Vaping status: Never Used  Substance and Sexual Activity   Alcohol use: Not Currently    Comment: Rarely   Drug use: Never   Sexual activity: Yes    Birth control/protection: I.U.D.  Other Topics Concern   Not on file  Social History Narrative   Not on file   Social Drivers of Health   Financial Resource Strain: Not on file  Food Insecurity: No Food Insecurity (06/10/2022)   Received from [facility]   Hunger Vital Sign    Within the past 12 months, you worried that your food would run out before you got the money to buy more.: Never true    Within the past 12 months, the food you bought just didn't last and you didn't have money to get more.: Never true  Transportation Needs: Not on file  Physical Activity: Not on file  Stress: Not on file  Social Connections: Unknown (12/16/2021)   Received from [facility]   Social Network    Social Network: Not on file  Intimate Partner Violence: Unknown (11/07/2021)   Received from [facility]   HITS    Physically Hurt: Not on file    Insult or Talk Down To: Not on file    Threaten Physical Harm: Not on file    Scream or Curse: Not on file   Family History  Problem Relation Age of Onset   Breast cancer Mother    Cancer Mother    COPD Mother    Pancreatic cancer Father    COPD Father    Squamous cell carcinoma Father        skin   Cancer Maternal Grandmother    COPD Maternal Grandmother    Colon cancer Neg Hx    Esophageal cancer Neg Hx    Rectal cancer Neg Hx    Stomach cancer Neg Hx     Objective: Office vital signs reviewed. BP 111/73   Pulse 92   Temp 98.1 F (36.7 C)   Ht 5' 4.5 (1.638 m)   Wt 231 lb (104.8 kg)   SpO2 95%   BMI 39.04 kg/m   Physical Examination:  General: Awake, alert, morbidly obese, No acute distress HEENT: Sclera white.  Moist mucous membranes.  She does have slight thyromegaly present.  Neck circumference is 19.25 cm Cardio: regular rate and rhythm, S1S2 heard, no murmurs  appreciated Pulm: clear to auscultation bilaterally, no wheezes, rhonchi or rales; normal work of breathing on room air     05/09/2024    9:42 AM 05/01/2024    2:32 PM 03/24/2024    2:53 PM  Depression screen PHQ 2/9  Decreased Interest 3 3 2   Down, Depressed, Hopeless 3 2 3   PHQ - 2 Score 6 5 5  Altered sleeping 3 3 3   Tired, decreased energy 3 3 3   Change in appetite 3 3 3   Feeling bad or failure about yourself  2 1 2   Trouble concentrating 1 1 1   Moving slowly or fidgety/restless 0 0 0  Suicidal thoughts 0 0 0  PHQ-9 Score 18  16  17    Difficult doing work/chores Very difficult Very difficult Very difficult     Data saved with a previous flowsheet row definition      05/09/2024    9:43 AM 05/01/2024    2:33 PM 03/24/2024    2:53 PM 12/22/2023    3:53 PM  GAD 7 : Generalized Anxiety Score  Nervous, Anxious, on Edge 3 3 3 3   Control/stop worrying 2 3 3 3   Worry too much - different things 3 3 3 3   Trouble relaxing 3 3 3 3   Restless 0 0 0 0  Easily annoyed or irritable 2 1 1 1   Afraid - awful might happen 3 2 3 3   Total GAD 7 Score 16 15 16 16   Anxiety Difficulty Very difficult Very difficult Somewhat difficult Very difficult    Assessment/ Plan: 53 y.o. female   Attention deficit hyperactivity disorder (ADHD), predominantly inattentive type - Plan: amphetamine -dextroamphetamine  (ADDERALL) 10 MG tablet, amphetamine -dextroamphetamine  (ADDERALL) 10 MG tablet  Hypothyroidism due to Hashimoto's thyroiditis - Plan: TSH + free T4  Chronic fatigue - Plan: Vitamin B12, Iron, TIBC and Ferritin Panel, Ambulatory referral to Sleep Studies  Morbid obesity (HCC) - Plan: Ambulatory referral to Sleep Studies  Tobacco use disorder   ADHD is stable.  Continue current regimen.  National narcotic database reviewed and there were no red flags.  Medications have been renewed as she is up-to-date on UDS and CSC  Recheck thyroid  levels given TSH over 30.  Clinically doing better with  Synthroid  225 mcg daily but will check iron studies, B12 per her request as well as placed referral to sleep studies for home sleep study  She is almost off of cigarettes totally but still depending on vape.  She has a plan in place to transition off of nicotine totally soon   [name], DO [facility] [phone]

## 2024-06-27 ENCOUNTER — Ambulatory Visit: Payer: Self-pay | Admitting: Family Medicine

## 2024-06-27 LAB — TSH+FREE T4
Free T4: 1.59 ng/dL (ref 0.82–1.77)
TSH: 1.39 u[IU]/mL (ref 0.450–4.500)

## 2024-06-27 LAB — IRON,TIBC AND FERRITIN PANEL
Ferritin: 260 ng/mL — ABNORMAL HIGH (ref 15–150)
Iron Saturation: 37 % (ref 15–55)
Iron: 105 ug/dL (ref 27–159)
Total Iron Binding Capacity: 287 ug/dL (ref 250–450)
UIBC: 182 ug/dL (ref 131–425)

## 2024-06-27 LAB — VITAMIN B12: Vitamin B-12: 1098 pg/mL (ref 232–1245)

## 2024-07-07 ENCOUNTER — Encounter: Payer: Self-pay | Admitting: Adult Health

## 2024-07-07 ENCOUNTER — Ambulatory Visit: Admitting: Adult Health

## 2024-07-07 VITALS — BP 125/80 | HR 85 | Ht 64.5 in | Wt 234.0 lb

## 2024-07-07 DIAGNOSIS — Z975 Presence of (intrauterine) contraceptive device: Secondary | ICD-10-CM | POA: Insufficient documentation

## 2024-07-07 DIAGNOSIS — R4589 Other symptoms and signs involving emotional state: Secondary | ICD-10-CM | POA: Insufficient documentation

## 2024-07-07 DIAGNOSIS — R232 Flushing: Secondary | ICD-10-CM | POA: Insufficient documentation

## 2024-07-07 DIAGNOSIS — R52 Pain, unspecified: Secondary | ICD-10-CM | POA: Diagnosis not present

## 2024-07-07 DIAGNOSIS — R4189 Other symptoms and signs involving cognitive functions and awareness: Secondary | ICD-10-CM | POA: Diagnosis not present

## 2024-07-07 DIAGNOSIS — F418 Other specified anxiety disorders: Secondary | ICD-10-CM | POA: Diagnosis not present

## 2024-07-07 DIAGNOSIS — G479 Sleep disorder, unspecified: Secondary | ICD-10-CM | POA: Diagnosis not present

## 2024-07-07 DIAGNOSIS — N951 Menopausal and female climacteric states: Secondary | ICD-10-CM | POA: Diagnosis not present

## 2024-07-07 DIAGNOSIS — R5383 Other fatigue: Secondary | ICD-10-CM | POA: Diagnosis not present

## 2024-07-07 DIAGNOSIS — Z780 Asymptomatic menopausal state: Secondary | ICD-10-CM | POA: Insufficient documentation

## 2024-07-07 DIAGNOSIS — F419 Anxiety disorder, unspecified: Secondary | ICD-10-CM | POA: Insufficient documentation

## 2024-07-07 MED ORDER — ESTRADIOL 0.0375 MG/24HR TD PTTW: 1.0000 | MEDICATED_PATCH | TRANSDERMAL | 4 refills | Status: AC

## 2024-07-07 NOTE — Progress Notes (Signed)
 Subjective:     Patient ID: Julie Alvarado, female   DOB: 06/21/1971, 53 y.o.   MRN: [id number]  HPI Alayshia is a 53 year old white female,single, PM in complaining of brain fog, depression,moody sleep disturbance, hot flashes and body aches. Has back and hip issues and has seen ortho for that. 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  She has mirena  IUD.  And she says she is tired. She is going to get a sleep study she says.    Component Value Date/Time   DIAGPAP  11/04/2022 1454    - Negative for intraepithelial lesion or malignancy (NILM)   HPVHIGH Negative 11/04/2022 1454   ADEQPAP  11/04/2022 1454    Satisfactory for evaluation; transformation zone component ABSENT.   PCP is Dr Jolinda  Review of Systems  +brain fog, + depression,moody  +sleep disturbance,  +hot flashes  +tired + body aches. (Has back and hip issues and has seen ortho for that) She has an IUD She is not sexually active Reviewed past medical,surgical, social and family history. Reviewed medications and allergies.     Objective:   Physical Exam BP 125/80 (BP Location: Left Arm, Patient Position: Sitting, Cuff Size: Large)   Pulse 85   Ht 5' 4.5 (1.638 m)   Wt 234 lb (106.1 kg)   BMI 39.55 kg/m   Skin warm and dry.  Lungs: clear to ausculation bilaterally. Cardiovascular: regular rate and rhythm. Fall risk is low AA is 2    07/07/2024   11:21 AM 06/26/2024    4:21 PM 05/09/2024    9:42 AM  Depression screen PHQ 2/9  Decreased Interest 3 3 3   Down, Depressed, Hopeless 3 2 3   PHQ - 2 Score 6 5 6   Altered sleeping 3 2 3   Tired, decreased energy 3 3 3   Change in appetite 3 2 3   Feeling bad or failure about yourself  3 2 2   Trouble concentrating 0 1 1  Moving slowly or fidgety/restless 0 0 0  Suicidal thoughts 0 0 0  PHQ-9 Score 18 15 18    Difficult doing work/chores  Very difficult Very difficult     Data saved with a previous flowsheet row definition            07/07/2024   11:21 AM 06/26/2024    4:21 PM 05/09/2024    9:43 AM 05/01/2024    2:33 PM  GAD 7 : Generalized Anxiety Score  Nervous, Anxious, on Edge 3 3 3 3   Control/stop worrying 3 3 2 3   Worry too much - different things 3 3 3 3   Trouble relaxing 3 3 3 3   Restless 0 0 0 0  Easily annoyed or irritable 1 1 2 1   Afraid - awful might happen 2 2 3 2   Total GAD 7 Score 15 15 16 15   Anxiety Difficulty  Very difficult Very difficult Very difficult    Upstream - 07/07/24 1118       Pregnancy Intention Screening   Does the patient want to become pregnant in the next year? N/A    Does the patient's partner want to become pregnant in the next year? N/A    Would the patient like to discuss contraceptive options today? N/A      Contraception Wrap Up   Current Method Abstinence;IUD or IUS;Post-Menopause    End Method Abstinence;IUD or IUS;Post-Menopause    Contraception Counseling Provided No  Assessment:     1. Postmenopause FSH was 39.8 03/24/24  2. Hot flashes  3. Sleep disturbance She says her PCP is getting her a sleep study  4. Brain fog (Primary) Discussed adding some estrogen She denies MI, stroke DVT or breast cancer, or migraines with aura Did discuss with Dr Ozan Rx sent in for vivelle  dot 0.0375 mg patch Meds ordered this encounter  Medications   estradiol  (VIVELLE -DOT) 0.0375 MG/24HR    Sig: Place 1 patch onto the skin 2 (two) times a week.    Dispense:  8 patch    Refill:  4    Supervising Provider:   JAYNE, LUTHER H [[id number]]    5. Body aches   6. Tired   7. Anxiety and depression She is on Zoloft  and Wellbutrin  and sees PCP  8. Moody  9. IUD (intrauterine device) in place Mirena  placed 08/24/19    Plan:    She does smoke 2-3 cigarettes some days and vaps but will try to quit  Follow up in 8 weeks for ROS

## 2024-09-01 ENCOUNTER — Ambulatory Visit: Admitting: Adult Health

## 2024-10-02 ENCOUNTER — Ambulatory Visit: Admitting: Family Medicine

## 2025-05-11 ENCOUNTER — Encounter: Payer: Self-pay | Admitting: Family Medicine
